# Patient Record
Sex: Female | Born: 1935 | Race: White | Hispanic: No | Smoking: Never smoker
Health system: Southern US, Community
[De-identification: ages and names within clinical notes are randomized; demographics above are authoritative.]

## PROBLEM LIST (undated history)

## (undated) DIAGNOSIS — N281 Cyst of kidney, acquired: Secondary | ICD-10-CM

## (undated) DIAGNOSIS — R300 Dysuria: Secondary | ICD-10-CM

## (undated) DIAGNOSIS — D696 Thrombocytopenia, unspecified: Secondary | ICD-10-CM

## (undated) DIAGNOSIS — Z950 Presence of cardiac pacemaker: Secondary | ICD-10-CM

## (undated) DIAGNOSIS — I7300 Raynaud's syndrome without gangrene: Secondary | ICD-10-CM

## (undated) DIAGNOSIS — L309 Dermatitis, unspecified: Secondary | ICD-10-CM

## (undated) DIAGNOSIS — M792 Neuralgia and neuritis, unspecified: Secondary | ICD-10-CM

## (undated) DIAGNOSIS — M359 Systemic involvement of connective tissue, unspecified: Secondary | ICD-10-CM

## (undated) DIAGNOSIS — R600 Localized edema: Secondary | ICD-10-CM

## (undated) DIAGNOSIS — M1990 Unspecified osteoarthritis, unspecified site: Secondary | ICD-10-CM

## (undated) DIAGNOSIS — R001 Bradycardia, unspecified: Secondary | ICD-10-CM

## (undated) DIAGNOSIS — R609 Edema, unspecified: Secondary | ICD-10-CM

## (undated) DIAGNOSIS — K7689 Other specified diseases of liver: Secondary | ICD-10-CM

## (undated) DIAGNOSIS — K5790 Diverticulosis of intestine, part unspecified, without perforation or abscess without bleeding: Secondary | ICD-10-CM

## (undated) DIAGNOSIS — R52 Pain, unspecified: Secondary | ICD-10-CM

## (undated) HISTORY — PX: PARATHYROIDECTOMY: SHX19

## (undated) HISTORY — PX: CHOLECYSTECTOMY: SHX55

## (undated) HISTORY — PX: ABDOMINAL HYSTERECTOMY: SHX81

## (undated) HISTORY — DX: Presence of cardiac pacemaker: Z95.0

## (undated) HISTORY — PX: EYE SURGERY: SHX253

---

## 2012-11-30 ENCOUNTER — Ambulatory Visit: Payer: Self-pay | Admitting: Family Medicine

## 2013-09-13 ENCOUNTER — Ambulatory Visit (INDEPENDENT_AMBULATORY_CARE_PROVIDER_SITE_OTHER): Payer: 59 | Admitting: Podiatry

## 2013-09-13 ENCOUNTER — Ambulatory Visit (INDEPENDENT_AMBULATORY_CARE_PROVIDER_SITE_OTHER): Payer: 59

## 2013-09-13 ENCOUNTER — Encounter: Payer: Self-pay | Admitting: Podiatry

## 2013-09-13 VITALS — BP 126/73 | HR 75 | Resp 16 | Ht <= 58 in | Wt 115.0 lb

## 2013-09-13 DIAGNOSIS — M722 Plantar fascial fibromatosis: Secondary | ICD-10-CM

## 2013-09-13 DIAGNOSIS — R609 Edema, unspecified: Secondary | ICD-10-CM

## 2013-09-13 DIAGNOSIS — M79609 Pain in unspecified limb: Secondary | ICD-10-CM

## 2013-09-13 DIAGNOSIS — B351 Tinea unguium: Secondary | ICD-10-CM

## 2013-09-13 NOTE — Progress Notes (Signed)
She presents today with a chief complaint of painful toenails bilateral. Is also complaining of a painful lesion to the plantar aspect of the forefoot right. Her major complaint is the swelling and pain along the plantar plantar lateral aspect of the left foot. She denies trauma she states is chronic in nature and she really does nothing for it. She has tried different shoe gear to no avail.  Objective: Pulses are palpable bilateral. Hammertoe deformities are present bilateral. Her nails are thick yellow dystrophic with mycotic and painful palpation bilateral. She has pain on palpation to the lateral aspect of the left foot along the peroneal tendons where there is some swelling. She has good range of motion and no loss of function along her peroneals. Radiographic evaluation does not demonstrate any type of osseous abnormalities in this area. Porokeratosis to the plantar aspect of the right forefoot.  Assessment: Edema with peroneal tendinitis left foot. Pain in limb secondary to onychomycosis 1 through 5 bilateral. Porokeratosis plantar aspect of the right foot.

## 2013-12-20 ENCOUNTER — Ambulatory Visit: Payer: Self-pay | Admitting: Family Medicine

## 2014-03-14 ENCOUNTER — Ambulatory Visit (INDEPENDENT_AMBULATORY_CARE_PROVIDER_SITE_OTHER): Payer: 59 | Admitting: Podiatry

## 2014-03-14 ENCOUNTER — Encounter: Payer: Self-pay | Admitting: Podiatry

## 2014-03-14 VITALS — BP 114/64 | HR 80 | Resp 12

## 2014-03-14 DIAGNOSIS — M722 Plantar fascial fibromatosis: Secondary | ICD-10-CM | POA: Diagnosis not present

## 2014-03-14 DIAGNOSIS — B351 Tinea unguium: Secondary | ICD-10-CM | POA: Diagnosis not present

## 2014-03-14 DIAGNOSIS — M79609 Pain in unspecified limb: Secondary | ICD-10-CM | POA: Diagnosis not present

## 2014-03-14 DIAGNOSIS — Q828 Other specified congenital malformations of skin: Secondary | ICD-10-CM

## 2014-03-14 NOTE — Progress Notes (Signed)
She presents today for 6 month follow-up bilateral foot. She states that the left heel just doesn't seem to be getting any better and is hard spot on the bottom of my right foot continues to return. I also need help trimming these nails. They're painful and they hurt with shoes.  Objective: Vital signs are stable she is alert and oriented 3. Mildly tibia deformity and hammertoe deformities bilateral. Pulses are palpable bilateral. She has pain on palpation medial calcaneal tubercle of the left heel she has a porokeratotic lesion without complication some metatarsophalangeal joint right foot.  Assessment: Porokeratosis right plantar fascitis left. Pain in limb secondary to onychomycosis.  Plan: Debridement of all reactive hyperkeratosis. Debridement of nails 1 through 5. Injected the left heel today with Kenalog and local anesthetic. Follow-up with her in 6 months.

## 2014-04-10 ENCOUNTER — Emergency Department: Payer: Self-pay | Admitting: Physician Assistant

## 2014-04-20 ENCOUNTER — Ambulatory Visit
Admit: 2014-04-20 | Disposition: A | Discharge status: Home / Self Care | Payer: Self-pay | Attending: Orthopedic Surgery | Admitting: Orthopedic Surgery

## 2014-08-02 ENCOUNTER — Encounter
Admission: RE | Admit: 2014-08-02 | Discharge: 2014-08-02 | Disposition: A | Discharge status: Home / Self Care | Payer: Medicare PPO | Source: Ambulatory Visit | Attending: Ophthalmology | Admitting: Ophthalmology

## 2014-08-02 DIAGNOSIS — I10 Essential (primary) hypertension: Secondary | ICD-10-CM | POA: Insufficient documentation

## 2014-08-02 DIAGNOSIS — Z01810 Encounter for preprocedural cardiovascular examination: Secondary | ICD-10-CM | POA: Insufficient documentation

## 2014-08-03 ENCOUNTER — Encounter: Payer: Self-pay | Admitting: *Deleted

## 2014-08-03 DIAGNOSIS — K7689 Other specified diseases of liver: Secondary | ICD-10-CM | POA: Diagnosis not present

## 2014-08-03 DIAGNOSIS — N281 Cyst of kidney, acquired: Secondary | ICD-10-CM | POA: Diagnosis not present

## 2014-08-03 DIAGNOSIS — M1990 Unspecified osteoarthritis, unspecified site: Secondary | ICD-10-CM | POA: Diagnosis not present

## 2014-08-03 DIAGNOSIS — R609 Edema, unspecified: Secondary | ICD-10-CM | POA: Diagnosis not present

## 2014-08-03 DIAGNOSIS — M810 Age-related osteoporosis without current pathological fracture: Secondary | ICD-10-CM | POA: Diagnosis not present

## 2014-08-03 DIAGNOSIS — I7300 Raynaud's syndrome without gangrene: Secondary | ICD-10-CM | POA: Diagnosis not present

## 2014-08-03 DIAGNOSIS — M792 Neuralgia and neuritis, unspecified: Secondary | ICD-10-CM | POA: Diagnosis not present

## 2014-08-03 DIAGNOSIS — I252 Old myocardial infarction: Secondary | ICD-10-CM | POA: Diagnosis not present

## 2014-08-03 DIAGNOSIS — I739 Peripheral vascular disease, unspecified: Secondary | ICD-10-CM | POA: Diagnosis not present

## 2014-08-03 DIAGNOSIS — H2511 Age-related nuclear cataract, right eye: Secondary | ICD-10-CM | POA: Diagnosis present

## 2014-08-03 DIAGNOSIS — G709 Myoneural disorder, unspecified: Secondary | ICD-10-CM | POA: Diagnosis not present

## 2014-08-08 ENCOUNTER — Encounter: Payer: Self-pay | Admitting: *Deleted

## 2014-08-08 ENCOUNTER — Ambulatory Visit: Payer: Medicare PPO | Admitting: Anesthesiology

## 2014-08-08 ENCOUNTER — Ambulatory Visit
Admission: EM | Admit: 2014-08-08 | Discharge: 2014-08-08 | Disposition: A | Discharge status: Home / Self Care | Payer: Medicare PPO | Source: Ambulatory Visit | Attending: Ophthalmology | Admitting: Ophthalmology

## 2014-08-08 ENCOUNTER — Encounter
Admission: EM | Disposition: A | Discharge status: Home / Self Care | Payer: Self-pay | Source: Ambulatory Visit | Attending: Ophthalmology

## 2014-08-08 DIAGNOSIS — M1990 Unspecified osteoarthritis, unspecified site: Secondary | ICD-10-CM | POA: Insufficient documentation

## 2014-08-08 DIAGNOSIS — I739 Peripheral vascular disease, unspecified: Secondary | ICD-10-CM | POA: Insufficient documentation

## 2014-08-08 DIAGNOSIS — M810 Age-related osteoporosis without current pathological fracture: Secondary | ICD-10-CM | POA: Insufficient documentation

## 2014-08-08 DIAGNOSIS — G709 Myoneural disorder, unspecified: Secondary | ICD-10-CM | POA: Insufficient documentation

## 2014-08-08 DIAGNOSIS — M792 Neuralgia and neuritis, unspecified: Secondary | ICD-10-CM | POA: Insufficient documentation

## 2014-08-08 DIAGNOSIS — K7689 Other specified diseases of liver: Secondary | ICD-10-CM | POA: Insufficient documentation

## 2014-08-08 DIAGNOSIS — I7300 Raynaud's syndrome without gangrene: Secondary | ICD-10-CM | POA: Insufficient documentation

## 2014-08-08 DIAGNOSIS — R609 Edema, unspecified: Secondary | ICD-10-CM | POA: Insufficient documentation

## 2014-08-08 DIAGNOSIS — N281 Cyst of kidney, acquired: Secondary | ICD-10-CM | POA: Insufficient documentation

## 2014-08-08 DIAGNOSIS — I252 Old myocardial infarction: Secondary | ICD-10-CM | POA: Insufficient documentation

## 2014-08-08 DIAGNOSIS — H2511 Age-related nuclear cataract, right eye: Secondary | ICD-10-CM | POA: Diagnosis not present

## 2014-08-08 HISTORY — DX: Pain, unspecified: R52

## 2014-08-08 HISTORY — DX: Neuralgia and neuritis, unspecified: M79.2

## 2014-08-08 HISTORY — DX: Edema, unspecified: R60.9

## 2014-08-08 HISTORY — PX: CATARACT EXTRACTION W/PHACO: SHX586

## 2014-08-08 HISTORY — DX: Other specified diseases of liver: K76.89

## 2014-08-08 HISTORY — DX: Raynaud's syndrome without gangrene: I73.00

## 2014-08-08 HISTORY — DX: Unspecified osteoarthritis, unspecified site: M19.90

## 2014-08-08 HISTORY — DX: Cyst of kidney, acquired: N28.1

## 2014-08-08 SURGERY — PHACOEMULSIFICATION, CATARACT, WITH IOL INSERTION
Anesthesia: Monitor Anesthesia Care | Laterality: Right | Wound class: Clean

## 2014-08-08 MED ORDER — LIDOCAINE HCL (PF) 4 % IJ SOLN
INTRAMUSCULAR | Status: DC | PRN
  Administered 2014-08-08: 5 mL via OPHTHALMIC

## 2014-08-08 MED ORDER — EPINEPHRINE HCL 1 MG/ML IJ SOLN
INTRAMUSCULAR | Status: DC | PRN
  Administered 2014-08-08: 200 mL

## 2014-08-08 MED ORDER — NA CHONDROIT SULF-NA HYALURON 40-17 MG/ML IO SOLN
INTRAOCULAR | Status: DC | PRN
  Administered 2014-08-08: 1 mL via INTRAOCULAR

## 2014-08-08 MED ORDER — ALFENTANIL 500 MCG/ML IJ INJ
INJECTION | INTRAMUSCULAR | Status: DC | PRN
  Administered 2014-08-08: 700 ug via INTRAVENOUS

## 2014-08-08 MED ORDER — CARBACHOL 0.01 % IO SOLN
INTRAOCULAR | Status: DC | PRN
  Administered 2014-08-08: 0.5 mL via INTRAOCULAR

## 2014-08-08 MED ORDER — MOXIFLOXACIN HCL 0.5 % OP SOLN
1.0000 [drp] | OPHTHALMIC | Status: AC | PRN
  Administered 2014-08-08 (×3): 1 [drp] via OPHTHALMIC

## 2014-08-08 MED ORDER — MIDAZOLAM HCL 2 MG/2ML IJ SOLN
INTRAMUSCULAR | Status: DC | PRN
  Administered 2014-08-08 (×2): 1 mg via INTRAVENOUS

## 2014-08-08 MED ORDER — CYCLOPENTOLATE HCL 2 % OP SOLN
1.0000 [drp] | OPHTHALMIC | Status: AC | PRN
  Administered 2014-08-08 (×4): 1 [drp] via OPHTHALMIC

## 2014-08-08 MED ORDER — LIDOCAINE HCL (PF) 1 % IJ SOLN
INTRAOCULAR | Status: DC | PRN
  Administered 2014-08-08: 4 mL via OPHTHALMIC

## 2014-08-08 MED ORDER — TETRACAINE HCL 0.5 % OP SOLN
OPHTHALMIC | Status: DC | PRN
  Administered 2014-08-08: 1 [drp] via OPHTHALMIC

## 2014-08-08 MED ORDER — SODIUM CHLORIDE 0.9 % IV SOLN
INTRAVENOUS | Status: DC
  Administered 2014-08-08 (×2): via INTRAVENOUS

## 2014-08-08 MED ORDER — MOXIFLOXACIN HCL 0.5 % OP SOLN
OPHTHALMIC | Status: DC | PRN
  Administered 2014-08-08: 1 [drp] via OPHTHALMIC

## 2014-08-08 MED ORDER — PHENYLEPHRINE HCL 10 % OP SOLN
1.0000 [drp] | OPHTHALMIC | Status: AC | PRN
  Administered 2014-08-08 (×4): 1 [drp] via OPHTHALMIC

## 2014-08-08 SURGICAL SUPPLY — 25 items
CORD BIP STRL DISP 12FT (MISCELLANEOUS) ×3 IMPLANT
DRAPE XRAY CASSETTE 23X24 (DRAPES) ×3 IMPLANT
ERASER HMR WETFIELD 18G (MISCELLANEOUS) ×3 IMPLANT
GLOVE BIO SURGEON STRL SZ8 (GLOVE) ×3 IMPLANT
GLOVE SURG LX 6.5 MICRO (GLOVE) ×2
GLOVE SURG LX 8.0 MICRO (GLOVE) ×2
GLOVE SURG LX STRL 6.5 MICRO (GLOVE) ×1 IMPLANT
GLOVE SURG LX STRL 8.0 MICRO (GLOVE) ×1 IMPLANT
GOWN STRL REUS W/ TWL LRG LVL3 (GOWN DISPOSABLE) ×1 IMPLANT
GOWN STRL REUS W/ TWL XL LVL3 (GOWN DISPOSABLE) ×1 IMPLANT
GOWN STRL REUS W/TWL LRG LVL3 (GOWN DISPOSABLE) ×2
GOWN STRL REUS W/TWL XL LVL3 (GOWN DISPOSABLE) ×2
LENS IOL ACRYSERT 17.0 (Intraocular Lens) ×3 IMPLANT
PACK CATARACT (MISCELLANEOUS) ×3 IMPLANT
PACK CATARACT DINGLEDEIN LX (MISCELLANEOUS) ×3 IMPLANT
PACK EYE AFTER SURG (MISCELLANEOUS) ×3 IMPLANT
SHLD EYE VISITEC  UNIV (MISCELLANEOUS) ×3 IMPLANT
SN6CWS ACRYSOF LENS ×3 IMPLANT
SOL PREP PVP 2OZ (MISCELLANEOUS) ×3
SOLUTION PREP PVP 2OZ (MISCELLANEOUS) ×1 IMPLANT
SUT SILK 5-0 (SUTURE) ×3 IMPLANT
SYR 5ML LL (SYRINGE) ×3 IMPLANT
SYR TB 1ML 27GX1/2 LL (SYRINGE) ×3 IMPLANT
WATER STERILE IRR 1000ML POUR (IV SOLUTION) ×3 IMPLANT
WIPE NON LINTING 3.25X3.25 (MISCELLANEOUS) ×3 IMPLANT

## 2014-08-08 NOTE — Op Note (Signed)
Date of Surgery: 08/08/2014 Date of Dictation: 08/08/2014 8:11 AM Pre-operative Diagnosis:  Nuclear Sclerotic Cataract right Eye Post-operative Diagnosis: same Procedure performed: Extra-capsular Cataract Extraction (ECCE) with placement of a posterior chamber intraocular lens (IOL) right Eye IOL:  Implant Name Type Inv. Item Serial No. Manufacturer Lot No. LRB No. Used  [id number] ACRYSOF LENS     [id number] ALCON   Right 1   Anesthesia: 2% Lidocaine and 4% Marcaine in a 50/50 mixture with 10 unites/ml of Hylenex given as a peribulbar Anesthesiologist: Anesthesiologist: Andria Frames, MD CRNA: Sinda Du, CRNA Complications: none Estimated Blood Loss: less than 1 ml  Description of procedure:  The patient was given anesthesia and sedation via intravenous access. The patient was then prepped and draped in the usual fashion. A 25-gauge needle was bent for initiating the capsulorhexis. A 5-0 silk suture was placed through the conjunctiva superior and inferiorly to serve as bridle sutures. Hemostasis was obtained at the superior limbus using an eraser cautery. A partial thickness groove was made at the anterior surgical limbus with a 64 Beaver blade and this was dissected anteriorly with an Avaya. The anterior chamber was entered at 10 o'clock with a 1.0 mm paracentesis knife and through the lamellar dissection with a 2.6 mm Alcon keratome. DiscoVisc was injected to replace the aqueous and a continuous tear curvilinear capsulorhexis was performed using a bent 25-gauge needle.  Balance salt on a syringe was used to perform hydro-dissection and phacoemulsification was carried out using a divide and conquer technique. Procedure(s) with comments: CATARACT EXTRACTION PHACO AND INTRAOCULAR LENS PLACEMENT (IOC) (Right) - US:00:52 AP:24.8 CDE:22.37 Pack Lot #:[id number]. Irrigation/aspiration was used to remove the residual cortex and the capsular bag was inflated with DiscoVisc. The  intraocular lens was inserted into the capsular bag using a pre-loaded UltraSert Delivery System. Irrigation/aspiration was used to remove the residual DiscoVisc. The wound was inflated with balanced salt and checked for leaks. None were found. Miostat was injected via the paracentesis track and 0.1 ml of Vigamox containing 1 mg of drug  was injected via the paracentesis track. The wound was checked for leaks again and none were found.   The bridal sutures were removed and two drops of Vigamox were placed on the eye. An eye shield was placed to protect the eye and the patient was discharged to the recovery area in good condition.   Adrion Menz MD

## 2014-08-08 NOTE — Interval H&P Note (Signed)
History and Physical Interval Note:  08/08/2014 7:19 AM  [name]  has presented today for surgery, with the diagnosis of Cataract  The various methods of treatment have been discussed with the patient and family. After consideration of risks, benefits and other options for treatment, the patient has consented to  Procedure(s): CATARACT EXTRACTION PHACO AND INTRAOCULAR LENS PLACEMENT ([place]) (Right) as a surgical intervention .  The patient's history has been reviewed, patient examined, no change in status, stable for surgery.  I have reviewed the patient's chart and labs.  Questions were answered to the patient's satisfaction.     Kelli [name]

## 2014-08-08 NOTE — Anesthesia Postprocedure Evaluation (Signed)
  Anesthesia Post-op Note  Patient: Kelli [name]  Procedure(s) Performed: Procedure(s) with comments: CATARACT EXTRACTION PHACO AND INTRAOCULAR LENS PLACEMENT ([facility]) (Right) - US:00:52 AP:24.8 CDE:22.37 Pack Lot #:[id number]  Anesthesia type:MAC  Patient location: PACU  Post pain: Pain level controlled  Post assessment: Post-op Vital signs reviewed, Patient's Cardiovascular Status Stable, Respiratory Function Stable, Patent Airway and No signs of Nausea or vomiting  Post vital signs: Reviewed and stable  Last Vitals:  Filed Vitals:   08/08/14 0834  BP:   Pulse: 72  Temp:   Resp: 16    Level of consciousness: awake, alert  and patient cooperative  Complications: No apparent anesthesia complications

## 2014-08-08 NOTE — Discharge Instructions (Addendum)
..  see handout. Cataract Surgery Care After Refer to this sheet in the next few weeks. These instructions provide you with information on caring for yourself after your procedure. Your caregiver may also give you more specific instructions. Your treatment has been planned according to current medical practices, but problems sometimes occur. Call your caregiver if you have any problems or questions after your procedure.  HOME CARE INSTRUCTIONS   Avoid strenuous activities as directed by your caregiver.  Ask your caregiver when you can resume driving.  Use eyedrops or other medicines to help healing and control pressure inside your eye as directed by your caregiver.  Only take over-the-counter or prescription medicines for pain, discomfort, or fever as directed by your caregiver.  Do not to touch or rub your eyes.  You may be instructed to use a protective shield during the first few days and nights after surgery. If not, wear sunglasses to protect your eyes. This is to protect the eye from pressure or from being accidentally bumped.  Keep the area around your eye clean and dry. Avoid swimming or allowing water to hit you directly in the face while showering. Keep soap and shampoo out of your eyes.  Do not bend or lift heavy objects. Bending increases pressure in the eye. You can walk, climb stairs, and do light household chores.  Do not put a contact lens into the eye that had surgery until your caregiver says it is okay to do so.  Ask your doctor when you can return to work. This will depend on the kind of work that you do. If you work in a dusty environment, you may be advised to wear protective eyewear for a period of time.  Ask your caregiver when it will be safe to engage in sexual activity.  Continue with your regular eye exams as directed by your caregiver. What to expect:  It is normal to feel itching and mild discomfort for a few days after cataract surgery. Some fluid discharge  is also common, and your eye may be sensitive to light and touch.  After 1 to 2 days, even moderate discomfort should disappear. In most cases, healing will take about 6 weeks.  If you received an intraocular lens (IOL), you may notice that colors are very bright or have a blue tinge. Also, if you have been in bright sunlight, everything may appear reddish for a few hours. If you see these color tinges, it is because your lens is clear and no longer cloudy. Within a few months after receiving an IOL, these extra colors should go away. When you have healed, you will probably need new glasses. SEEK MEDICAL CARE IF:   You have increased bruising around your eye.  You have discomfort not helped by medicine. SEEK IMMEDIATE MEDICAL CARE IF:   You have a fever.  You have a worsening or sudden vision loss.  You have redness, swelling, or increasing pain in the eye.  You have a thick discharge from the eye that had surgery. MAKE SURE YOU:  Understand these instructions.  Will watch your condition.  Will get help right away if you are not doing well or get worse. Document Released: 07/27/2004 Document Revised: 04/01/2011 Document Reviewed: 08/31/2010 [facility] Patient Information 2015 [place], [place]. This information is not intended to replace advice given to you by your health care provider. Make sure you discuss any questions you have with your health care provider.

## 2014-08-08 NOTE — Anesthesia Preprocedure Evaluation (Signed)
Anesthesia Evaluation  Patient identified by MRN, date of birth, ID band Patient awake    Reviewed: Allergy & Precautions, H&P , NPO status , Patient's Chart, lab work & pertinent test results, reviewed documented beta blocker date and time   Airway Mallampati: II  TM Distance: >3 FB Neck ROM: full    Dental no notable dental hx. (+) Teeth Intact   Pulmonary neg pulmonary ROS,  breath sounds clear to auscultation  Pulmonary exam normal       Cardiovascular + Peripheral Vascular Disease - Past MI Normal cardiovascular examRhythm:regular Rate:Normal     Neuro/Psych  Neuromuscular disease negative psych ROS   GI/Hepatic negative GI ROS, Neg liver ROS,   Endo/Other  negative endocrine ROS  Renal/GU Renal disease  negative genitourinary   Musculoskeletal   Abdominal   Peds  Hematology negative hematology ROS (+)   Anesthesia Other Findings Past Medical History:   Neuralgia                                                      Comment:obsidia   Arthritis                                                    Cyst of kidney, acquired                                     Liver cyst                                                   Pain                                                           Comment:back/shoulder   Raynaud's disease                                            Edema                                                          Comment:feet/ankles   Reproductive/Obstetrics negative OB ROS                             Anesthesia Physical Anesthesia Plan  ASA: III  Anesthesia Plan: MAC   Post-op Pain Management:    Induction:   Airway Management Planned:   Additional Equipment:   Intra-op Plan:   Post-operative Plan:   Informed Consent:  I have reviewed the patients History and Physical, chart, labs and discussed the procedure including the risks, benefits and alternatives for the  proposed anesthesia with the patient or authorized representative who has indicated his/her understanding and acceptance.   Dental Advisory Given  Plan Discussed with: Anesthesiologist, CRNA and Surgeon  Anesthesia Plan Comments:         Anesthesia Quick Evaluation

## 2014-08-08 NOTE — H&P (Signed)
  History and physical was faxed and scanned in.   

## 2014-08-08 NOTE — Transfer of Care (Signed)
Immediate Anesthesia Transfer of Care Note  Patient: Kelli [name]  Procedure(s) Performed: Procedure(s) with comments: CATARACT EXTRACTION PHACO AND INTRAOCULAR LENS PLACEMENT ([facility]) (Right) - US:00:52 AP:24.8 CDE:22.37 Pack Lot #:[id number]  Patient Location: PACU  Anesthesia Type:MAC  Level of Consciousness: awake, alert  and oriented  Airway & Oxygen Therapy: Patient Spontanous Breathing  Post-op Assessment: Report given to RN and Post -op Vital signs reviewed and stable  Post vital signs: Reviewed  Last Vitals:  Filed Vitals:   08/08/14 0606  BP: 144/69  Pulse: 86  Temp: 36.6 C  Resp: 18    Complications: No apparent anesthesia complications

## 2014-09-12 ENCOUNTER — Ambulatory Visit (INDEPENDENT_AMBULATORY_CARE_PROVIDER_SITE_OTHER): Payer: 59 | Admitting: Podiatry

## 2014-09-12 ENCOUNTER — Encounter: Payer: Self-pay | Admitting: Podiatry

## 2014-09-12 DIAGNOSIS — B070 Plantar wart: Secondary | ICD-10-CM

## 2014-09-12 DIAGNOSIS — M79609 Pain in unspecified limb: Secondary | ICD-10-CM | POA: Diagnosis not present

## 2014-09-12 DIAGNOSIS — B351 Tinea unguium: Secondary | ICD-10-CM | POA: Diagnosis not present

## 2014-09-12 DIAGNOSIS — Q828 Other specified congenital malformations of skin: Secondary | ICD-10-CM
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
## 2014-09-14 ENCOUNTER — Encounter: Payer: Self-pay | Admitting: *Deleted

## 2014-09-14 DIAGNOSIS — Z881 Allergy status to other antibiotic agents status: Secondary | ICD-10-CM | POA: Diagnosis not present

## 2014-09-14 DIAGNOSIS — H2512 Age-related nuclear cataract, left eye: Secondary | ICD-10-CM | POA: Diagnosis present

## 2014-09-14 DIAGNOSIS — Z882 Allergy status to sulfonamides status: Secondary | ICD-10-CM | POA: Diagnosis not present

## 2014-09-14 DIAGNOSIS — Z885 Allergy status to narcotic agent status: Secondary | ICD-10-CM | POA: Diagnosis not present

## 2014-09-14 DIAGNOSIS — Z888 Allergy status to other drugs, medicaments and biological substances status: Secondary | ICD-10-CM | POA: Diagnosis not present

## 2014-09-14 DIAGNOSIS — G8929 Other chronic pain: Secondary | ICD-10-CM | POA: Diagnosis not present

## 2014-09-14 DIAGNOSIS — I739 Peripheral vascular disease, unspecified: Secondary | ICD-10-CM | POA: Diagnosis not present

## 2014-09-14 DIAGNOSIS — M1990 Unspecified osteoarthritis, unspecified site: Secondary | ICD-10-CM | POA: Diagnosis not present

## 2014-09-19 ENCOUNTER — Ambulatory Visit: Payer: Medicare PPO | Admitting: Certified Registered Nurse Anesthetist

## 2014-09-19 ENCOUNTER — Encounter: Payer: Self-pay | Admitting: *Deleted

## 2014-09-19 ENCOUNTER — Encounter
Admission: RE | Disposition: A | Discharge status: Home / Self Care | Payer: Self-pay | Source: Ambulatory Visit | Attending: Ophthalmology

## 2014-09-19 ENCOUNTER — Ambulatory Visit
Admission: RE | Admit: 2014-09-19 | Discharge: 2014-09-19 | Disposition: A | Discharge status: Home / Self Care | Payer: Medicare PPO | Source: Ambulatory Visit | Attending: Ophthalmology | Admitting: Ophthalmology

## 2014-09-19 DIAGNOSIS — Z885 Allergy status to narcotic agent status: Secondary | ICD-10-CM | POA: Insufficient documentation

## 2014-09-19 DIAGNOSIS — H2512 Age-related nuclear cataract, left eye: Secondary | ICD-10-CM | POA: Insufficient documentation

## 2014-09-19 DIAGNOSIS — Z882 Allergy status to sulfonamides status: Secondary | ICD-10-CM | POA: Insufficient documentation

## 2014-09-19 DIAGNOSIS — Z888 Allergy status to other drugs, medicaments and biological substances status: Secondary | ICD-10-CM | POA: Insufficient documentation

## 2014-09-19 DIAGNOSIS — Z881 Allergy status to other antibiotic agents status: Secondary | ICD-10-CM | POA: Insufficient documentation

## 2014-09-19 DIAGNOSIS — I739 Peripheral vascular disease, unspecified: Secondary | ICD-10-CM | POA: Insufficient documentation

## 2014-09-19 DIAGNOSIS — G8929 Other chronic pain: Secondary | ICD-10-CM | POA: Insufficient documentation

## 2014-09-19 DIAGNOSIS — M1990 Unspecified osteoarthritis, unspecified site: Secondary | ICD-10-CM | POA: Insufficient documentation

## 2014-09-19 HISTORY — DX: Thrombocytopenia, unspecified: D69.6

## 2014-09-19 HISTORY — PX: CATARACT EXTRACTION W/PHACO: SHX586

## 2014-09-19 HISTORY — DX: Raynaud's syndrome without gangrene: I73.00

## 2014-09-19 SURGERY — PHACOEMULSIFICATION, CATARACT, WITH IOL INSERTION
Anesthesia: Monitor Anesthesia Care | Site: Eye | Laterality: Left

## 2014-09-19 MED ORDER — MOXIFLOXACIN HCL 0.5 % OP SOLN
OPHTHALMIC | Status: DC | PRN
Start: 2014-09-19 — End: 2014-09-19
  Administered 2014-09-19: 9 [drp] via OPHTHALMIC

## 2014-09-19 MED ORDER — NA CHONDROIT SULF-NA HYALURON 40-17 MG/ML IO SOLN
INTRAOCULAR | Status: AC
  Filled 2014-09-19: qty 1

## 2014-09-19 MED ORDER — PHENYLEPHRINE HCL 10 % OP SOLN
OPHTHALMIC | Status: AC
  Administered 2014-09-19: 08:00:00 via OPHTHALMIC
  Filled 2014-09-19: qty 5

## 2014-09-19 MED ORDER — ONDANSETRON HCL 4 MG/2ML IJ SOLN: 4.0000 mg | Freq: Once | INTRAMUSCULAR | Status: DC | PRN

## 2014-09-19 MED ORDER — EPINEPHRINE HCL 1 MG/ML IJ SOLN
INTRAMUSCULAR | Status: AC
  Filled 2014-09-19: qty 2

## 2014-09-19 MED ORDER — CYCLOPENTOLATE HCL 2 % OP SOLN
OPHTHALMIC | Status: AC
  Administered 2014-09-19: 08:00:00
  Filled 2014-09-19: qty 2

## 2014-09-19 MED ORDER — TETRACAINE HCL 0.5 % OP SOLN
OPHTHALMIC | Status: AC
  Filled 2014-09-19: qty 2

## 2014-09-19 MED ORDER — CEFUROXIME OPHTHALMIC INJECTION 1 MG/0.1 ML
INJECTION | OPHTHALMIC | Status: AC
  Filled 2014-09-19: qty 0.1

## 2014-09-19 MED ORDER — PHENYLEPHRINE HCL 10 % OP SOLN
1.0000 [drp] | OPHTHALMIC | Status: AC | PRN
  Administered 2014-09-19 (×4): 1 [drp] via OPHTHALMIC

## 2014-09-19 MED ORDER — NA CHONDROIT SULF-NA HYALURON 40-17 MG/ML IO SOLN
INTRAOCULAR | Status: DC | PRN
Start: 2014-09-19 — End: 2014-09-19
  Administered 2014-09-19: 1 mL via INTRAOCULAR

## 2014-09-19 MED ORDER — SODIUM CHLORIDE 0.9 % IV SOLN
INTRAVENOUS | Status: DC
  Administered 2014-09-19: 08:00:00 via INTRAVENOUS

## 2014-09-19 MED ORDER — MOXIFLOXACIN HCL 0.5 % OP SOLN
1.0000 [drp] | OPHTHALMIC | Status: AC | PRN
  Administered 2014-09-19 (×3): 1 [drp] via OPHTHALMIC

## 2014-09-19 MED ORDER — TETRACAINE HCL 0.5 % OP SOLN
OPHTHALMIC | Status: DC | PRN
  Administered 2014-09-19: 2 [drp] via OPHTHALMIC

## 2014-09-19 MED ORDER — BUPIVACAINE HCL (PF) 0.75 % IJ SOLN
INTRAMUSCULAR | Status: AC
  Filled 2014-09-19: qty 10

## 2014-09-19 MED ORDER — MIDAZOLAM HCL 2 MG/2ML IJ SOLN
INTRAMUSCULAR | Status: DC | PRN
  Administered 2014-09-19: 0.5 mg via INTRAVENOUS

## 2014-09-19 MED ORDER — CYCLOPENTOLATE HCL 2 % OP SOLN
1.0000 [drp] | OPHTHALMIC | Status: AC | PRN
  Administered 2014-09-19 (×4): 1 [drp] via OPHTHALMIC

## 2014-09-19 MED ORDER — EPINEPHRINE HCL 1 MG/ML IJ SOLN
INTRAOCULAR | Status: DC | PRN
  Administered 2014-09-19: 200 mL via OPHTHALMIC

## 2014-09-19 MED ORDER — CARBACHOL 0.01 % IO SOLN
INTRAOCULAR | Status: DC | PRN
  Administered 2014-09-19: 0.5 mL via INTRAOCULAR

## 2014-09-19 MED ORDER — ALFENTANIL 500 MCG/ML IJ INJ
INJECTION | INTRAMUSCULAR | Status: DC | PRN
  Administered 2014-09-19: 250 ug via INTRAVENOUS

## 2014-09-19 MED ORDER — FENTANYL CITRATE (PF) 100 MCG/2ML IJ SOLN: 25.0000 ug | INTRAMUSCULAR | Status: DC | PRN

## 2014-09-19 MED ORDER — HYALURONIDASE HUMAN 150 UNIT/ML IJ SOLN
INTRAMUSCULAR | Status: AC
  Filled 2014-09-19: qty 1

## 2014-09-19 MED ORDER — LIDOCAINE HCL (PF) 4 % IJ SOLN
INTRAMUSCULAR | Status: AC
  Filled 2014-09-19: qty 5

## 2014-09-19 MED ORDER — MOXIFLOXACIN HCL 0.5 % OP SOLN
OPHTHALMIC | Status: AC
  Filled 2014-09-19: qty 3

## 2014-09-19 MED ORDER — LIDOCAINE HCL (PF) 1 % IJ SOLN
INTRAMUSCULAR | Status: DC | PRN
Start: 2014-09-19 — End: 2014-09-19
  Administered 2014-09-19: 4 mL via OPHTHALMIC

## 2014-09-19 MED ORDER — LIDOCAINE HCL (PF) 4 % IJ SOLN
INTRAMUSCULAR | Status: DC | PRN
  Administered 2014-09-19: 4 mL via OPHTHALMIC

## 2014-09-19 SURGICAL SUPPLY — 30 items
CANNULA ANT/CHMB 27GA (MISCELLANEOUS) ×3 IMPLANT
CORD BIP STRL DISP 12FT (MISCELLANEOUS) ×3 IMPLANT
CUP MEDICINE 2OZ PLAST GRAD ST (MISCELLANEOUS) ×3 IMPLANT
DRAPE XRAY CASSETTE 23X24 (DRAPES) ×3 IMPLANT
ERASER HMR WETFIELD 18G (MISCELLANEOUS) ×3 IMPLANT
GLOVE BIO SURGEON STRL SZ8 (GLOVE) ×3 IMPLANT
GLOVE SURG LX 6.5 MICRO (GLOVE) ×2
GLOVE SURG LX 8.0 MICRO (GLOVE) ×2
GLOVE SURG LX STRL 6.5 MICRO (GLOVE) ×1 IMPLANT
GLOVE SURG LX STRL 8.0 MICRO (GLOVE) ×1 IMPLANT
GOWN STRL REUS W/ TWL LRG LVL3 (GOWN DISPOSABLE) ×1 IMPLANT
GOWN STRL REUS W/ TWL XL LVL3 (GOWN DISPOSABLE) ×1 IMPLANT
GOWN STRL REUS W/TWL LRG LVL3 (GOWN DISPOSABLE) ×2
GOWN STRL REUS W/TWL XL LVL3 (GOWN DISPOSABLE) ×2
LENS IOL ACRSF IQ ULTRA 17.5 (Intraocular Lens) ×1 IMPLANT
LENS IOL ACRYSOF IQ 17.5 (Intraocular Lens) ×3 IMPLANT
PACK CATARACT (MISCELLANEOUS) ×3 IMPLANT
PACK CATARACT DINGLEDEIN LX (MISCELLANEOUS) ×3 IMPLANT
PACK EYE AFTER SURG (MISCELLANEOUS) ×3 IMPLANT
SHLD EYE VISITEC  UNIV (MISCELLANEOUS) ×3 IMPLANT
SOL BSS BAG (MISCELLANEOUS) ×3
SOL PREP PVP 2OZ (MISCELLANEOUS) ×3
SOLUTION BSS BAG (MISCELLANEOUS) ×1 IMPLANT
SOLUTION PREP PVP 2OZ (MISCELLANEOUS) ×1 IMPLANT
SUT SILK 5-0 (SUTURE) ×3 IMPLANT
SYR 3ML LL SCALE MARK (SYRINGE) ×3 IMPLANT
SYR 5ML LL (SYRINGE) ×3 IMPLANT
SYR TB 1ML 27GX1/2 LL (SYRINGE) ×3 IMPLANT
WATER STERILE IRR 1000ML POUR (IV SOLUTION) ×3 IMPLANT
WIPE NON LINTING 3.25X3.25 (MISCELLANEOUS) ×3 IMPLANT

## 2014-09-19 NOTE — Interval H&P Note (Signed)
History and Physical Interval Note:  09/19/2014 8:56 AM  Kelli [name]  has presented today for surgery, with the diagnosis of CATARACT  The various methods of treatment have been discussed with the patient and family. After consideration of risks, benefits and other options for treatment, the patient has consented to  Procedure(s): CATARACT EXTRACTION PHACO AND INTRAOCULAR LENS PLACEMENT ([place]) (Left) as a surgical intervention .  The patient's history has been reviewed, patient examined, no change in status, stable for surgery.  I have reviewed the patient's chart and labs.  Questions were answered to the patient's satisfaction.     [name]

## 2014-09-19 NOTE — Anesthesia Procedure Notes (Signed)
Procedure Name: MAC Performed by: Ardel Jagger Pre-anesthesia Checklist: Patient identified, Suction available, Emergency Drugs available, Patient being monitored and Timeout performed Oxygen Delivery Method: Nasal cannula       

## 2014-09-19 NOTE — Transfer of Care (Signed)
Immediate Anesthesia Transfer of Care Note  Patient: Kelli [name]  Procedure(s) Performed: Procedure(s) with comments: CATARACT EXTRACTION PHACO AND INTRAOCULAR LENS PLACEMENT ([facility]) (Left) - [place]: 01:05.6 AP%: 25.7 CDE: 29.05 Lot # [id number]  Patient Location: PACU  Anesthesia Type:MAC  Level of Consciousness: awake, alert  and oriented  Airway & Oxygen Therapy: Patient Spontanous Breathing  Post-op Assessment: Report given to RN and Post -op Vital signs reviewed and stable  Post vital signs: Reviewed and stable  Last Vitals:  Filed Vitals:   09/19/14 0939  BP: 118/50  Pulse: 68  Temp: 36.1 C  Resp: 16    Complications: No apparent anesthesia complications

## 2014-09-19 NOTE — Anesthesia Postprocedure Evaluation (Signed)
  Anesthesia Post-op Note  Patient: Kelli [name]  Procedure(s) Performed: Procedure(s) with comments: CATARACT EXTRACTION PHACO AND INTRAOCULAR LENS PLACEMENT ([facility]) (Left) - [place]: 01:05.6 AP%: 25.7 CDE: 29.05 Lot # [id number]  Anesthesia type:MAC  Patient location: PACU  Post pain: Pain level controlled  Post assessment: Post-op Vital signs reviewed, Patient's Cardiovascular Status Stable, Respiratory Function Stable, Patent Airway and No signs of Nausea or vomiting  Post vital signs: Reviewed and stable  Last Vitals:  Filed Vitals:   09/19/14 0939  BP: 118/50  Pulse: 68  Temp: 36.1 C  Resp: 16    Level of consciousness: awake, alert  and patient cooperative  Complications: No apparent anesthesia complications

## 2014-09-19 NOTE — Anesthesia Preprocedure Evaluation (Addendum)
Anesthesia Evaluation  Patient identified by MRN, date of birth, ID band Patient awake    Reviewed: Allergy & Precautions, NPO status , Patient's Chart, lab work & pertinent test results  Airway Mallampati: I  TM Distance: >3 FB Neck ROM: Limited    Dental  (+) Teeth Intact   Pulmonary    Pulmonary exam normal       Cardiovascular Exercise Tolerance: Poor + Peripheral Vascular Disease Normal cardiovascular exam    Neuro/Psych Chronic pain.    GI/Hepatic   Endo/Other    Renal/GU      Musculoskeletal  (+) Arthritis -, Osteoarthritis,    Abdominal   Peds  Hematology   Anesthesia Other Findings   Reproductive/Obstetrics                            Anesthesia Physical Anesthesia Plan  ASA: III  Anesthesia Plan: MAC   Post-op Pain Management:    Induction: Intravenous  Airway Management Planned: Nasal Cannula  Additional Equipment:   Intra-op Plan:   Post-operative Plan:   Informed Consent: I have reviewed the patients History and Physical, chart, labs and discussed the procedure including the risks, benefits and alternatives for the proposed anesthesia with the patient or authorized representative who has indicated his/her understanding and acceptance.     Plan Discussed with: CRNA  Anesthesia Plan Comments:         Anesthesia Quick Evaluation

## 2014-09-19 NOTE — Discharge Instructions (Addendum)
See handoutAMBULATORY SURGERY  DISCHARGE INSTRUCTIONS   1) The drugs that you were given will stay in your system until tomorrow so for the next 24 hours you should not:  A) Drive an automobile B) Make any legal decisions C) Drink any alcoholic beverage   2) You may resume regular meals tomorrow.  Today it is better to start with liquids and gradually work up to solid foods.  You may eat anything you prefer, but it is better to start with liquids, then soup and crackers, and gradually work up to solid foods.   3) Please notify your doctor immediately if you have any unusual bleeding, trouble breathing, redness and pain at the surgery site, drainage, fever, or pain not relieved by medication.    4) Additional Instructions:        Please contact your physician with any problems or Same Day Surgery at [phone], Monday through Friday 6 am to 4 pm, or [place] at [facility] number at [phone]. Eye Surgery Discharge Instructions  Expect mild scratchy sensation or mild soreness. DO NOT RUB YOUR EYE!  The day of surgery:  Minimal physical activity, but bed rest is not required  No reading, computer work, or close hand work  No bending, lifting, or straining.  May watch TV  For 24 hours:  No driving, legal decisions, or alcoholic beverages  Safety precautions  Eat anything you prefer: It is better to start with liquids, then soup then solid foods.  _____ Eye patch should be worn until postoperative exam tomorrow.  ____ Solar shield eyeglasses should be worn for comfort in the sunlight/patch while sleeping  Resume all regular medications including aspirin or Coumadin if these were discontinued prior to surgery. You may shower, bathe, shave, or wash your hair. Tylenol may be taken for mild discomfort.  Call your doctor if you experience significant pain, nausea, or vomiting, fever > 101 or other signs of infection. [phone] or [phone] Specific  instructions:  Follow-up Information    Follow up with DINGELDEIN,STEVEN, MD In 1 day.   Specialty:  Ophthalmology   Why:  August 30 at 10:10am   Contact information:   [address]   [place] [address] [phone]

## 2014-09-19 NOTE — Addendum Note (Signed)
Addendum  created 09/19/14 0946 by Elyse Hsu, MD   Modules edited: Notes Section   Notes Section:  File: [id number]

## 2014-09-19 NOTE — Op Note (Signed)
Date of Surgery: 09/19/2014 Date of Dictation: 09/19/2014 9:36 AM Pre-operative Diagnosis:  Nuclear Sclerotic Cataract left Eye Post-operative Diagnosis: same Procedure performed: Extra-capsular Cataract Extraction (ECCE) with placement of a posterior chamber intraocular lens (IOL) left Eye IOL:  Implant Name Type Inv. Item Serial No. Manufacturer Lot No. LRB No. Used  LENS IOL ACRYSOF IQ 17.5 - [id number] Intraocular Lens LENS IOL ACRYSOF IQ 17.5 [id number] ALCON   Left 1   Anesthesia: 2% Lidocaine and 4% Marcaine in a 50/50 mixture with 10 unites/ml of Hylenex given as a peribulbar Anesthesiologist: Anesthesiologist: Elyse Hsu, MD CRNA: Demetrius Charity, CRNA Complications: none Estimated Blood Loss: less than 1 ml  Description of procedure:  The patient was given anesthesia and sedation via intravenous access. The patient was then prepped and draped in the usual fashion. A 25-gauge needle was bent for initiating the capsulorhexis. A 5-0 silk suture was placed through the conjunctiva superior and inferiorly to serve as bridle sutures. Hemostasis was obtained at the superior limbus using an eraser cautery. A partial thickness groove was made at the anterior surgical limbus with a 64 Beaver blade and this was dissected anteriorly with an Avaya. The anterior chamber was entered at 10 o'clock with a 1.0 mm paracentesis knife and through the lamellar dissection with a 2.6 mm Alcon keratome. Epi-Shugarcaine 0.5 CC [9 cc BSS Plus (Alcon), 3 cc 4% preservative-free lidocaine ([place]) and 4 cc 1:1000 preservative-free, bisulfite-free epinephrine] was injected via the paracentesis tract. DiscoVisc was injected to replace the aqueous and a continuous tear curvilinear capsulorhexis was performed using a bent 25-gauge needle.  Balance salt on a syringe was used to perform hydro-dissection and phacoemulsification was carried out using a divide and conquer technique. Procedure(s) with  comments: CATARACT EXTRACTION PHACO AND INTRAOCULAR LENS PLACEMENT ([facility]) (Left) - [place]: 01:05.6 AP%: 25.7 CDE: 29.05 Lot # [id number]. Irrigation/aspiration was used to remove the residual cortex and the capsular bag was inflated with DiscoVisc. The intraocular lens was inserted into the capsular bag using a pre-loaded UltraSert Delivery System. Irrigation/aspiration was used to remove the residual DiscoVisc. The wound was inflated with balanced salt and checked for leaks. None were found. Miostat was injected via the paracentesis track and 0.1 ml of Vigamox containing 1 mg of drug  was injected via the paracentesis track. The wound was checked for leaks again and none were found.   The bridal sutures were removed and two drops of Vigamox were placed on the eye. An eye shield was placed to protect the eye and the patient was discharged to the recovery area in good condition.   Tamari Busic MD

## 2014-09-19 NOTE — H&P (Signed)
  See scanned notes. 

## 2014-09-19 NOTE — Anesthesia Postprocedure Evaluation (Signed)
  Anesthesia Post-op Note  Patient: Kelli [name]  Procedure(s) Performed: Procedure(s) with comments: CATARACT EXTRACTION PHACO AND INTRAOCULAR LENS PLACEMENT ([facility]) (Left) - [place]: 01:05.6 AP%: 25.7 CDE: 29.05 Lot # [id number]  Anesthesia type:MAC  Patient location: PACU  Post pain: Pain level controlled  Post assessment: Post-op Vital signs reviewed, Patient's Cardiovascular Status Stable, Respiratory Function Stable, Patent Airway and No signs of Nausea or vomiting  Post vital signs: Reviewed and stable  Last Vitals:  Filed Vitals:   09/19/14 0939  BP: 118/50  Pulse: 68  Temp: 36.1 C  Resp: 16    Level of consciousness: awake, alert  and patient cooperative  Complications: No apparent anesthesia complications

## 2014-12-07 ENCOUNTER — Other Ambulatory Visit: Payer: Self-pay | Admitting: Family Medicine

## 2014-12-07 DIAGNOSIS — Z1231 Encounter for screening mammogram for malignant neoplasm of breast: Secondary | ICD-10-CM

## 2014-12-19 ENCOUNTER — Other Ambulatory Visit: Payer: Self-pay | Admitting: Orthopedic Surgery

## 2014-12-19 DIAGNOSIS — M75101 Unspecified rotator cuff tear or rupture of right shoulder, not specified as traumatic: Secondary | ICD-10-CM

## 2014-12-19 DIAGNOSIS — M75102 Unspecified rotator cuff tear or rupture of left shoulder, not specified as traumatic: Secondary | ICD-10-CM

## 2014-12-26 ENCOUNTER — Ambulatory Visit
Admission: RE | Admit: 2014-12-26 | Discharge: 2014-12-26 | Disposition: A | Discharge status: Home / Self Care | Payer: Medicare PPO | Source: Ambulatory Visit | Attending: Family Medicine | Admitting: Family Medicine

## 2014-12-26 ENCOUNTER — Other Ambulatory Visit: Payer: Self-pay | Admitting: Family Medicine

## 2014-12-26 DIAGNOSIS — Z1231 Encounter for screening mammogram for malignant neoplasm of breast: Secondary | ICD-10-CM | POA: Diagnosis present

## 2015-01-06 ENCOUNTER — Ambulatory Visit
Admission: RE | Admit: 2015-01-06 | Discharge: 2015-01-06 | Disposition: A | Discharge status: Home / Self Care | Payer: Medicare PPO | Source: Ambulatory Visit | Attending: Orthopedic Surgery | Admitting: Orthopedic Surgery

## 2015-01-06 DIAGNOSIS — M25512 Pain in left shoulder: Secondary | ICD-10-CM | POA: Diagnosis present

## 2015-01-06 DIAGNOSIS — M75101 Unspecified rotator cuff tear or rupture of right shoulder, not specified as traumatic: Secondary | ICD-10-CM | POA: Diagnosis present

## 2015-01-06 DIAGNOSIS — M25511 Pain in right shoulder: Secondary | ICD-10-CM | POA: Diagnosis present

## 2015-01-06 DIAGNOSIS — M19012 Primary osteoarthritis, left shoulder: Secondary | ICD-10-CM | POA: Diagnosis not present

## 2015-01-06 DIAGNOSIS — M75121 Complete rotator cuff tear or rupture of right shoulder, not specified as traumatic: Secondary | ICD-10-CM | POA: Insufficient documentation

## 2015-01-06 DIAGNOSIS — M75102 Unspecified rotator cuff tear or rupture of left shoulder, not specified as traumatic: Secondary | ICD-10-CM

## 2015-01-06 DIAGNOSIS — S43431A Superior glenoid labrum lesion of right shoulder, initial encounter: Secondary | ICD-10-CM | POA: Insufficient documentation

## 2015-02-27 DIAGNOSIS — M75121 Complete rotator cuff tear or rupture of right shoulder, not specified as traumatic: Secondary | ICD-10-CM | POA: Insufficient documentation

## 2015-02-27 DIAGNOSIS — M7582 Other shoulder lesions, left shoulder: Secondary | ICD-10-CM | POA: Insufficient documentation

## 2015-03-13 ENCOUNTER — Ambulatory Visit (INDEPENDENT_AMBULATORY_CARE_PROVIDER_SITE_OTHER): Payer: Medicare Other | Admitting: Podiatry

## 2015-03-13 ENCOUNTER — Encounter: Payer: Self-pay | Admitting: Podiatry

## 2015-03-13 DIAGNOSIS — B351 Tinea unguium: Secondary | ICD-10-CM

## 2015-03-13 DIAGNOSIS — M79676 Pain in unspecified toe(s): Secondary | ICD-10-CM | POA: Diagnosis not present

## 2015-03-13 DIAGNOSIS — Q828 Other specified congenital malformations of skin: Secondary | ICD-10-CM | POA: Diagnosis not present

## 2015-03-13 DIAGNOSIS — M722 Plantar fascial fibromatosis: Secondary | ICD-10-CM | POA: Diagnosis not present

## 2015-03-13 NOTE — Progress Notes (Signed)
She presents today for follow-up of her plantar fasciitis is ALSO to have her nails and calluses trimmed. She states that the injection last time did not help at all.  Objective: Vital signs are stable she is alert and oriented 3. Pulses are palpable. Toenails are thick yellow dystrophic likely mycotic and reactive hyperkeratotic lesions presence of fourth metatarsal head of the right foot and subsecond metatarsal head of the left foot. She has pain on palpation medial calcaneal tubercle of the left heel.  Assessment: Plantar fasciitis left. Pain in limb secondary to onychomycosis and porokeratosis.  Plan: Debrided all reactive hyperkeratosis and toenails bilateral. I also had her scan for set of orthotics.

## 2015-03-15 NOTE — Patient Instructions (Signed)

## 2015-04-14 ENCOUNTER — Ambulatory Visit (INDEPENDENT_AMBULATORY_CARE_PROVIDER_SITE_OTHER): Payer: Medicare Other | Admitting: *Deleted

## 2015-04-14 DIAGNOSIS — M722 Plantar fascial fibromatosis: Secondary | ICD-10-CM

## 2015-04-14 NOTE — Progress Notes (Signed)
Dispensed patient's orthotics with oral and written instructions for wearing. Patient will follow up with Dr. Hyatt in 1 month for an orthotic check. 

## 2015-05-10 DIAGNOSIS — M1711 Unilateral primary osteoarthritis, right knee: Secondary | ICD-10-CM | POA: Insufficient documentation

## 2015-05-29 ENCOUNTER — Encounter: Payer: Self-pay | Admitting: Podiatry

## 2015-05-29 ENCOUNTER — Ambulatory Visit (INDEPENDENT_AMBULATORY_CARE_PROVIDER_SITE_OTHER): Payer: Medicare Other | Admitting: Podiatry

## 2015-05-29 DIAGNOSIS — M79676 Pain in unspecified toe(s): Secondary | ICD-10-CM | POA: Diagnosis not present

## 2015-05-29 DIAGNOSIS — Q828 Other specified congenital malformations of skin: Secondary | ICD-10-CM

## 2015-05-29 DIAGNOSIS — B351 Tinea unguium: Secondary | ICD-10-CM | POA: Diagnosis not present

## 2015-05-29 DIAGNOSIS — M722 Plantar fascial fibromatosis: Secondary | ICD-10-CM

## 2015-05-31 NOTE — Progress Notes (Signed)
She presents today for a follow-up of her orthotics. As well as her painful toenails and calluses. She would like to have a metatarsal pad placed behind her metatarsals to help hold the heads of the metatarsals off the ground.  Objective: Vital signs are stable she is alert and oriented 3. Pulses are palpable. Toenails are thick yellow dystrophic onychomycotic and painful palpation. She has multiple porokeratosis distal aspect of the toes as well as plantar aspect of the forefoot.  Assessment: Pain and limp secondary to onychomycosis and porokeratosis.  Plan: Debridement of toenails bilateral. Debridement of all reactive hyperkeratosis. We will send her orthotics out for him at pad to be placed.

## 2015-07-31 ENCOUNTER — Encounter
Admission: RE | Admit: 2015-07-31 | Discharge: 2015-07-31 | Disposition: A | Discharge status: Home / Self Care | Payer: Medicare Other | Source: Ambulatory Visit | Attending: Surgery | Admitting: Surgery

## 2015-07-31 DIAGNOSIS — Z01818 Encounter for other preprocedural examination: Secondary | ICD-10-CM | POA: Diagnosis not present

## 2015-07-31 HISTORY — DX: Dermatitis, unspecified: L30.9

## 2015-07-31 HISTORY — DX: Diverticulosis of intestine, part unspecified, without perforation or abscess without bleeding: K57.90

## 2015-07-31 LAB — CBC
HEMATOCRIT: 35 % (ref 35.0–47.0)
Hemoglobin: 11.8 g/dL — ABNORMAL LOW (ref 12.0–16.0)
MCH: 29.3 pg (ref 26.0–34.0)
MCHC: 33.8 g/dL (ref 32.0–36.0)
MCV: 86.6 fL (ref 80.0–100.0)
Platelets: 186 10*3/uL (ref 150–440)
RBC: 4.04 MIL/uL (ref 3.80–5.20)
RDW: 13.8 % (ref 11.5–14.5)
WBC: 10 10*3/uL (ref 3.6–11.0)

## 2015-07-31 LAB — DIFFERENTIAL
BASOS ABS: 0.1 10*3/uL (ref 0–0.1)
BASOS PCT: 1 %
EOS ABS: 0.5 10*3/uL (ref 0–0.7)
Eosinophils Relative: 5 %
Lymphocytes Relative: 16 %
Lymphs Abs: 1.6 10*3/uL (ref 1.0–3.6)
MONO ABS: 0.7 10*3/uL (ref 0.2–0.9)
MONOS PCT: 7 %
Neutro Abs: 7.2 10*3/uL — ABNORMAL HIGH (ref 1.4–6.5)
Neutrophils Relative %: 71 %

## 2015-07-31 NOTE — Patient Instructions (Signed)
  Your procedure is scheduled [id number] 18, 2017 (Tuesday) Report to [facility] Day [facility] 2nd floor [facility] To find out your arrival time please call [phone] between 1PM - 3PM on August 07, 2015 (Monday)  Remember: Instructions that are not followed completely may result in serious medical risk, up to and including death, or upon the discretion of your surgeon and anesthesiologist your surgery may need to be rescheduled.    _x___ 1. Do not eat food or drink liquids after midnight. No gum chewing or hard candies.     __x__ 2. No Alcohol for 24 hours before or after surgery.   __x__3. No Smoking for 24 prior to surgery.   ____  4. Bring all medications with you on the day of surgery if instructed.    __x__ 5. Notify your doctor if there is any change in your medical condition     (cold, fever, infections).     Do not wear jewelry, make-up, hairpins, clips or nail polish.  Do not wear lotions, powders, or perfumes. You may wear deodorant.  Do not shave 48 hours prior to surgery. Men may shave face and neck.  Do not bring valuables to the hospital.    [facility] is not responsible for any belongings or valuables.               Contacts, dentures or bridgework may not be worn into surgery.  Leave your suitcase in the car. After surgery it may be brought to your room.  For patients admitted to the hospital, discharge time is determined by your treatment team.   Patients discharged the day of surgery will not be allowed to drive home.    Please read over the following fact sheets that you were given:   [facility] Preparing for Surgery and or MRSA Information   _x___ Take these medicines the morning of surgery with A SIP OF WATER:    1. Gabapentin  2.  3.  4.  5.  6.  ____ Fleet Enema (as directed)   _x___ Use CHG Soap or sage wipes as directed on instruction sheet   ____ Use inhalers on the day of surgery and bring to hospital day of surgery  ____ Stop metformin 2 days  prior to surgery    ____ Take 1/2 of usual insulin dose the night before surgery and none on the morning of  surgey         _x___ Stop aspirin or coumadin, or plavix (N/A)  _x__ Stop Anti-inflammatories such as Advil, Aleve, Ibuprofen, Motrin, Naproxen,          Naprosyn, Goodies powders or aspirin products. Ok to take Tylenol.   _x___ Stop supplements until after surgery.  (STOP VITAMIN B COMPLEX NOW)  ____ Bring C-Pap to the hospital.

## 2015-08-08 ENCOUNTER — Ambulatory Visit
Admission: RE | Admit: 2015-08-08 | Discharge: 2015-08-08 | Disposition: A | Discharge status: Home / Self Care | Payer: Medicare Other | Source: Ambulatory Visit | Attending: Surgery | Admitting: Surgery

## 2015-08-08 ENCOUNTER — Ambulatory Visit: Payer: Medicare Other | Admitting: Anesthesiology

## 2015-08-08 ENCOUNTER — Encounter: Payer: Self-pay | Admitting: *Deleted

## 2015-08-08 ENCOUNTER — Encounter
Admission: RE | Disposition: A | Discharge status: Home / Self Care | Payer: Self-pay | Source: Ambulatory Visit | Attending: Surgery

## 2015-08-08 DIAGNOSIS — Z79899 Other long term (current) drug therapy: Secondary | ICD-10-CM | POA: Insufficient documentation

## 2015-08-08 DIAGNOSIS — Z8042 Family history of malignant neoplasm of prostate: Secondary | ICD-10-CM | POA: Insufficient documentation

## 2015-08-08 DIAGNOSIS — Z885 Allergy status to narcotic agent status: Secondary | ICD-10-CM | POA: Insufficient documentation

## 2015-08-08 DIAGNOSIS — Z882 Allergy status to sulfonamides status: Secondary | ICD-10-CM | POA: Insufficient documentation

## 2015-08-08 DIAGNOSIS — M7541 Impingement syndrome of right shoulder: Secondary | ICD-10-CM | POA: Diagnosis not present

## 2015-08-08 DIAGNOSIS — Z90710 Acquired absence of both cervix and uterus: Secondary | ICD-10-CM | POA: Insufficient documentation

## 2015-08-08 DIAGNOSIS — M75101 Unspecified rotator cuff tear or rupture of right shoulder, not specified as traumatic: Secondary | ICD-10-CM | POA: Diagnosis present

## 2015-08-08 DIAGNOSIS — Z9049 Acquired absence of other specified parts of digestive tract: Secondary | ICD-10-CM | POA: Insufficient documentation

## 2015-08-08 DIAGNOSIS — Z888 Allergy status to other drugs, medicaments and biological substances status: Secondary | ICD-10-CM | POA: Insufficient documentation

## 2015-08-08 DIAGNOSIS — L409 Psoriasis, unspecified: Secondary | ICD-10-CM | POA: Insufficient documentation

## 2015-08-08 DIAGNOSIS — E892 Postprocedural hypoparathyroidism: Secondary | ICD-10-CM | POA: Diagnosis not present

## 2015-08-08 DIAGNOSIS — M19011 Primary osteoarthritis, right shoulder: Secondary | ICD-10-CM | POA: Diagnosis not present

## 2015-08-08 DIAGNOSIS — Z8249 Family history of ischemic heart disease and other diseases of the circulatory system: Secondary | ICD-10-CM | POA: Insufficient documentation

## 2015-08-08 DIAGNOSIS — M810 Age-related osteoporosis without current pathological fracture: Secondary | ICD-10-CM | POA: Insufficient documentation

## 2015-08-08 DIAGNOSIS — Z803 Family history of malignant neoplasm of breast: Secondary | ICD-10-CM | POA: Insufficient documentation

## 2015-08-08 DIAGNOSIS — M75121 Complete rotator cuff tear or rupture of right shoulder, not specified as traumatic: Secondary | ICD-10-CM | POA: Diagnosis not present

## 2015-08-08 DIAGNOSIS — Z881 Allergy status to other antibiotic agents status: Secondary | ICD-10-CM | POA: Insufficient documentation

## 2015-08-08 HISTORY — PX: SHOULDER ARTHROSCOPY WITH OPEN ROTATOR CUFF REPAIR: SHX6092

## 2015-08-08 SURGERY — ARTHROSCOPY, SHOULDER WITH REPAIR, ROTATOR CUFF, OPEN
Anesthesia: Regional | Laterality: Right | Wound class: Clean

## 2015-08-08 MED ORDER — LACTATED RINGERS IV SOLN
INTRAVENOUS | Status: DC
  Administered 2015-08-08: 10:00:00 via INTRAVENOUS

## 2015-08-08 MED ORDER — ACETAMINOPHEN 10 MG/ML IV SOLN
INTRAVENOUS | Status: AC
  Filled 2015-08-08: qty 100

## 2015-08-08 MED ORDER — OXYCODONE-ACETAMINOPHEN 2.5-325 MG PO TABS: 1.0000 | ORAL_TABLET | ORAL | Status: DC | PRN

## 2015-08-08 MED ORDER — PROMETHAZINE HCL 25 MG/ML IJ SOLN: 6.2500 mg | INTRAMUSCULAR | Status: DC | PRN

## 2015-08-08 MED ORDER — VANCOMYCIN HCL IN DEXTROSE 1-5 GM/200ML-% IV SOLN
INTRAVENOUS | Status: AC
  Filled 2015-08-08: qty 200

## 2015-08-08 MED ORDER — ROPIVACAINE HCL 5 MG/ML IJ SOLN
INTRAMUSCULAR | Status: AC
  Filled 2015-08-08: qty 40

## 2015-08-08 MED ORDER — FENTANYL CITRATE (PF) 100 MCG/2ML IJ SOLN
INTRAMUSCULAR | Status: DC
Start: 2015-08-08 — End: 2015-08-08
  Filled 2015-08-08: qty 2

## 2015-08-08 MED ORDER — LIDOCAINE HCL (CARDIAC) 20 MG/ML IV SOLN
INTRAVENOUS | Status: DC | PRN
  Administered 2015-08-08: 70 mg via INTRAVENOUS

## 2015-08-08 MED ORDER — VANCOMYCIN HCL IN DEXTROSE 1-5 GM/200ML-% IV SOLN
1000.0000 mg | Freq: Once | INTRAVENOUS | Status: AC
  Administered 2015-08-08: 1000 mg via INTRAVENOUS

## 2015-08-08 MED ORDER — FENTANYL CITRATE (PF) 100 MCG/2ML IJ SOLN
25.0000 ug | Freq: Once | INTRAMUSCULAR | Status: AC
  Administered 2015-08-08: 25 ug via INTRAVENOUS

## 2015-08-08 MED ORDER — PROPOFOL 10 MG/ML IV BOLUS
INTRAVENOUS | Status: DC | PRN
  Administered 2015-08-08: 100 mg via INTRAVENOUS

## 2015-08-08 MED ORDER — OXYCODONE HCL 5 MG/5ML PO SOLN: 5.0000 mg | Freq: Once | ORAL | Status: DC | PRN

## 2015-08-08 MED ORDER — NEOSTIGMINE METHYLSULFATE 10 MG/10ML IV SOLN
INTRAVENOUS | Status: DC | PRN
  Administered 2015-08-08: 3 mg via INTRAVENOUS

## 2015-08-08 MED ORDER — ACETAMINOPHEN 10 MG/ML IV SOLN
INTRAVENOUS | Status: DC | PRN
  Administered 2015-08-08: 1000 mg via INTRAVENOUS

## 2015-08-08 MED ORDER — BUPIVACAINE-EPINEPHRINE (PF) 0.5% -1:200000 IJ SOLN
INTRAMUSCULAR | Status: AC
  Filled 2015-08-08: qty 30

## 2015-08-08 MED ORDER — MEPERIDINE HCL 25 MG/ML IJ SOLN: 6.2500 mg | INTRAMUSCULAR | Status: DC | PRN

## 2015-08-08 MED ORDER — DEXTROSE 5 % IV SOLN
10.0000 mg | INTRAVENOUS | Status: DC | PRN
  Administered 2015-08-08: 30 ug/min via INTRAVENOUS

## 2015-08-08 MED ORDER — EPINEPHRINE HCL 1 MG/ML IJ SOLN
INTRAMUSCULAR | Status: AC
  Filled 2015-08-08: qty 2

## 2015-08-08 MED ORDER — OXYCODONE HCL 5 MG PO TABS: 5.0000 mg | ORAL_TABLET | Freq: Once | ORAL | Status: DC | PRN

## 2015-08-08 MED ORDER — GLYCOPYRROLATE 0.2 MG/ML IJ SOLN
INTRAMUSCULAR | Status: DC | PRN
  Administered 2015-08-08: 0.4 mg via INTRAVENOUS

## 2015-08-08 MED ORDER — ONDANSETRON HCL 4 MG/2ML IJ SOLN
INTRAMUSCULAR | Status: DC | PRN
  Administered 2015-08-08: 4 mg via INTRAVENOUS

## 2015-08-08 MED ORDER — FENTANYL CITRATE (PF) 100 MCG/2ML IJ SOLN: 25.0000 ug | INTRAMUSCULAR | Status: DC | PRN

## 2015-08-08 MED ORDER — BUPIVACAINE-EPINEPHRINE 0.5% -1:200000 IJ SOLN
INTRAMUSCULAR | Status: DC | PRN
  Administered 2015-08-08: 30 mL

## 2015-08-08 MED ORDER — ROCURONIUM BROMIDE 100 MG/10ML IV SOLN
INTRAVENOUS | Status: DC | PRN
  Administered 2015-08-08: 40 mg via INTRAVENOUS

## 2015-08-08 MED ORDER — EPINEPHRINE HCL 1 MG/ML IJ SOLN
INTRAMUSCULAR | Status: DC | PRN
Start: 2015-08-08 — End: 2015-08-08
  Administered 2015-08-08: 2 mL

## 2015-08-08 MED ORDER — FAMOTIDINE 20 MG PO TABS
20.0000 mg | ORAL_TABLET | Freq: Once | ORAL | Status: AC
  Administered 2015-08-08: 20 mg via ORAL

## 2015-08-08 MED ORDER — DEXAMETHASONE SODIUM PHOSPHATE 4 MG/ML IJ SOLN
INTRAMUSCULAR | Status: DC | PRN
  Administered 2015-08-08: 5 mg via INTRAVENOUS

## 2015-08-08 MED ORDER — FENTANYL CITRATE (PF) 100 MCG/2ML IJ SOLN
INTRAMUSCULAR | Status: DC | PRN
  Administered 2015-08-08: 150 ug via INTRAVENOUS
  Administered 2015-08-08: 100 ug via INTRAVENOUS

## 2015-08-08 MED ORDER — MIDAZOLAM HCL 5 MG/5ML IJ SOLN
INTRAMUSCULAR | Status: AC
  Filled 2015-08-08: qty 5

## 2015-08-08 MED ORDER — FAMOTIDINE 20 MG PO TABS
ORAL_TABLET | ORAL | Status: AC
  Filled 2015-08-08: qty 1

## 2015-08-08 MED ORDER — ROPIVACAINE HCL 5 MG/ML IJ SOLN
INTRAMUSCULAR | Status: DC | PRN
  Administered 2015-08-08: 30 mL via PERINEURAL

## 2015-08-08 MED ORDER — LIDOCAINE HCL (PF) 1 % IJ SOLN
INTRAMUSCULAR | Status: AC
  Filled 2015-08-08: qty 5

## 2015-08-08 SURGICAL SUPPLY — 46 items
ANCHOR JUGGERKNOT WTAP NDL 2.9 (Anchor) ×6 IMPLANT
BIT DRILL JUGRKNT W/NDL BIT2.9 (DRILL) ×1 IMPLANT
BLADE FULL RADIUS 3.5 (BLADE) ×3 IMPLANT
BLADE SHAVER 4.5X7 STR FR (MISCELLANEOUS) IMPLANT
BUR ACROMIONIZER 4.0 (BURR) ×3 IMPLANT
BUR BR 5.5 WIDE MOUTH (BURR) IMPLANT
CANNULA SHAVER 8MMX76MM (CANNULA) ×3 IMPLANT
CHLORAPREP W/TINT 26ML (MISCELLANEOUS) ×6 IMPLANT
COVER MAYO STAND STRL (DRAPES) ×3 IMPLANT
DRAPE IMP U-DRAPE 54X76 (DRAPES) ×6 IMPLANT
DRILL JUGGERKNOT W/NDL BIT 2.9 (DRILL) ×3
DRSG OPSITE POSTOP 4X8 (GAUZE/BANDAGES/DRESSINGS) ×3 IMPLANT
ELECT REM PT RETURN 9FT ADLT (ELECTROSURGICAL) ×3
ELECTRODE REM PT RTRN 9FT ADLT (ELECTROSURGICAL) ×1 IMPLANT
GAUZE PETRO XEROFOAM 1X8 (MISCELLANEOUS) ×3 IMPLANT
GAUZE SPONGE 4X4 12PLY STRL (GAUZE/BANDAGES/DRESSINGS) ×3 IMPLANT
GLOVE BIO SURGEON STRL SZ7.5 (GLOVE) ×6 IMPLANT
GLOVE BIO SURGEON STRL SZ8 (GLOVE) ×6 IMPLANT
GLOVE BIOGEL PI IND STRL 8 (GLOVE) ×1 IMPLANT
GLOVE BIOGEL PI INDICATOR 8 (GLOVE) ×2
GLOVE INDICATOR 8.0 STRL GRN (GLOVE) ×3 IMPLANT
GOWN STRL REUS W/ TWL LRG LVL3 (GOWN DISPOSABLE) ×2 IMPLANT
GOWN STRL REUS W/ TWL XL LVL3 (GOWN DISPOSABLE) ×1 IMPLANT
GOWN STRL REUS W/TWL LRG LVL3 (GOWN DISPOSABLE) ×4
GOWN STRL REUS W/TWL XL LVL3 (GOWN DISPOSABLE) ×2
GRASPER SUT 15 45D LOW PRO (SUTURE) IMPLANT
IV LACTATED RINGER IRRG 3000ML (IV SOLUTION) ×4
IV LR IRRIG 3000ML ARTHROMATIC (IV SOLUTION) ×2 IMPLANT
MANIFOLD NEPTUNE II (INSTRUMENTS) ×3 IMPLANT
MASK FACE SPIDER DISP (MASK) ×3 IMPLANT
MAT BLUE FLOOR 46X72 FLO (MISCELLANEOUS) ×3 IMPLANT
NDL FRENCH SPRG EYE 1/2 CRC (NEEDLE) ×3 IMPLANT
NEEDLE REVERSE CUT 1/2 CRC (NEEDLE) IMPLANT
PACK ARTHROSCOPY SHOULDER (MISCELLANEOUS) ×3 IMPLANT
SLING ARM LRG DEEP (SOFTGOODS) ×3 IMPLANT
SLING ULTRA II LG (MISCELLANEOUS) ×3 IMPLANT
STAPLER SKIN PROX 35W (STAPLE) ×3 IMPLANT
STRAP SAFETY BODY (MISCELLANEOUS) ×3 IMPLANT
SUT ETHIBOND 0 MO6 C/R (SUTURE) ×3 IMPLANT
SUT VIC AB 2-0 CT1 27 (SUTURE) ×4
SUT VIC AB 2-0 CT1 TAPERPNT 27 (SUTURE) ×2 IMPLANT
TAPE MICROFOAM 4IN (TAPE) ×3 IMPLANT
TUBING ARTHRO INFLOW-ONLY STRL (TUBING) ×3 IMPLANT
TUBING CONNECTING 10 (TUBING) ×2 IMPLANT
TUBING CONNECTING 10' (TUBING) ×1
WAND HAND CNTRL MULTIVAC 90 (MISCELLANEOUS) ×3 IMPLANT

## 2015-08-08 NOTE — Discharge Instructions (Addendum)
Keep dressing dry and intact.  May shower after dressing changed on post-op day #4 (Saturday).  Cover staples with Band-Aids after drying off. Apply ice frequently to shoulder. Keep shoulder immobilizer on at all times except may remove for bathing purposes. Follow-up in 10-14 days or as scheduled.  AMBULATORY SURGERY  DISCHARGE INSTRUCTIONS   1) The drugs that you were given will stay in your system until tomorrow so for the next 24 hours you should not:  A) Drive an automobile B) Make any legal decisions C) Drink any alcoholic beverage   2) You may resume regular meals tomorrow.  Today it is better to start with liquids and gradually work up to solid foods.  You may eat anything you prefer, but it is better to start with liquids, then soup and crackers, and gradually work up to solid foods.   3) Please notify your doctor immediately if you have any unusual bleeding, trouble breathing, redness and pain at the surgery site, drainage, fever, or pain not relieved by medication.    4) Additional Instructions:        Please contact your physician with any problems or Same Day Surgery at [phone], Monday through Friday 6 am to 4 pm, or [place] at [facility] number at [phone].
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
## 2015-08-08 NOTE — Anesthesia Postprocedure Evaluation (Signed)
Anesthesia Post Note  Patient: TAUNIA HENEHAN  Procedure(s) Performed: Procedure(s) (LRB): SHOULDER ARTHROSCOPY WITH OPEN ROTATOR CUFF REPAIR (Right)  Patient location during evaluation: PACU Anesthesia Type: General Level of consciousness: awake and alert and oriented Pain management: pain level controlled Vital Signs Assessment: post-procedure vital signs reviewed and stable Respiratory status: spontaneous breathing, nonlabored ventilation and respiratory function stable Cardiovascular status: blood pressure returned to baseline and stable Postop Assessment: no signs of nausea or vomiting Anesthetic complications: no    Last Vitals:  Filed Vitals:   08/08/15 1331 08/08/15 1346  BP: 100/57 100/70  Pulse: 62 60  Temp:    Resp: 12 15    Last Pain:  Filed Vitals:   08/08/15 1346  PainSc: 0-No pain                 Marcell Pfeifer

## 2015-08-08 NOTE — Progress Notes (Signed)
Still no pain on discharge

## 2015-08-08 NOTE — Progress Notes (Signed)
Pt. Taken to [facility] for nerve block.  Report given to [facility].

## 2015-08-08 NOTE — Anesthesia Preprocedure Evaluation (Signed)
Anesthesia Evaluation  Patient identified by MRN, date of birth, ID band Patient awake    Reviewed: Allergy & Precautions, NPO status , Patient's Chart, lab work & pertinent test results  History of Anesthesia Complications Negative for: history of anesthetic complications  Airway Mallampati: II  TM Distance: >3 FB Neck ROM: Full    Dental no notable dental hx.    Pulmonary neg COPD,    Pulmonary exam normal breath sounds clear to auscultation- rhonchi (-) wheezing      Cardiovascular Exercise Tolerance: Good (-) hypertension(-) CAD and (-) Past MI Normal cardiovascular exam Rhythm:Regular Rate:Normal - Systolic murmurs    Neuro/Psych  Neuromuscular disease negative psych ROS   GI/Hepatic negative GI ROS, Neg liver ROS,   Endo/Other  neg diabetes  Renal/GU Renal disease (renal cyst, being followed)     Musculoskeletal  (+) Arthritis , Osteoarthritis,    Abdominal Normal abdominal exam  (+)   Peds  Hematology negative hematology ROS (+)   Anesthesia Other Findings   Reproductive/Obstetrics                             Anesthesia Physical Anesthesia Plan  ASA: II  Anesthesia Plan: General and Regional   Post-op Pain Management: GA combined w/ Regional for post-op pain   Induction: Intravenous  Airway Management Planned: Oral ETT  Additional Equipment:   Intra-op Plan:   Post-operative Plan: Extubation in OR  Informed Consent: I have reviewed the patients History and Physical, chart, labs and discussed the procedure including the risks, benefits and alternatives for the proposed anesthesia with the patient or authorized representative who has indicated his/her understanding and acceptance.   Dental advisory given  Plan Discussed with:   Anesthesia Plan Comments:         Anesthesia Quick Evaluation

## 2015-08-08 NOTE — H&P (Signed)
Paper H&P to be scanned into permanent record. H&P reviewed. No changes. 

## 2015-08-08 NOTE — Progress Notes (Signed)
Discharged with sling intact

## 2015-08-08 NOTE — Transfer of Care (Signed)
Immediate Anesthesia Transfer of Care Note  Patient: Kelli [name]  Procedure(s) Performed: Procedure(s): SHOULDER ARTHROSCOPY WITH OPEN ROTATOR CUFF REPAIR (Right)  Patient Location: PACU  Anesthesia Type:General  Level of Consciousness: sedated  Airway & Oxygen Therapy: Patient Spontanous Breathing and Patient connected to nasal cannula oxygen  Post-op Assessment: Report given to RN and Post -op Vital signs reviewed and stable  Post vital signs: Reviewed and stable  Last Vitals:  Filed Vitals:   08/08/15 1047 08/08/15 1301  BP: 121/56 105/61  Pulse: 75 75  Temp:  36.1 C  Resp: 17 20    Last Pain: There were no vitals filed for this visit.       Complications: No apparent anesthesia complications

## 2015-08-08 NOTE — Anesthesia Procedure Notes (Addendum)
Anesthesia Regional Block:  Interscalene brachial plexus block  Pre-Anesthetic Checklist: ,, timeout performed, Correct Patient, Correct Site, Correct Laterality, Correct Procedure, Correct Position, site marked, Risks and benefits discussed,  Surgical consent,  Pre-op evaluation,  At surgeon's request and post-op pain management  Laterality: Right  Prep: chloraprep       Needles:  Injection technique: Single-shot  Needle Type: Stimiplex     Needle Length: 5cm 5 cm Needle Gauge: 22 and 22 G    Additional Needles:  Procedures: ultrasound guided (picture in chart) Interscalene brachial plexus block Narrative:  Start time: 08/08/2015 10:00 AM End time: 08/08/2015 10:18 AM Injection made incrementally with aspirations every 5 mL.  Performed by: Personally  Anesthesiologist: PENWARDEN, AMY  Additional Notes: Functioning IV was confirmed and monitors were applied.  A 43mm 22ga Stimuplex needle was used. Sterile prep and drape,hand hygiene and sterile gloves were used.  Negative aspiration and negative test dose prior to incremental administration of local anesthetic. The patient tolerated the procedure well.      Procedure Name: Intubation Date/Time: 08/08/2015 11:19 AM Performed by: Rosaria Ferries, Jourdan Maldonado Pre-anesthesia Checklist: Patient identified, Emergency Drugs available, Suction available and Patient being monitored Patient Re-evaluated:Patient Re-evaluated prior to inductionOxygen Delivery Method: Circle system utilized Preoxygenation: Pre-oxygenation with 100% oxygen Intubation Type: IV induction Laryngoscope Size: Mac and 3 Grade View: Grade I Tube type: Oral Tube size: 7.0 mm Number of attempts: 1 Placement Confirmation: ETT inserted through vocal cords under direct vision,  positive ETCO2 and breath sounds checked- equal and bilateral Secured at: 21 cm Tube secured with: Tape Dental Injury: Teeth and Oropharynx as per pre-operative assessment

## 2015-08-23 ENCOUNTER — Ambulatory Visit (INDEPENDENT_AMBULATORY_CARE_PROVIDER_SITE_OTHER): Payer: Medicare Other | Admitting: Podiatry

## 2015-08-23 DIAGNOSIS — M722 Plantar fascial fibromatosis: Secondary | ICD-10-CM

## 2015-08-23 DIAGNOSIS — Q828 Other specified congenital malformations of skin: Secondary | ICD-10-CM

## 2015-08-30 ENCOUNTER — Ambulatory Visit: Payer: Medicare Other | Admitting: Podiatry

## 2015-09-01 NOTE — Progress Notes (Signed)
Patient presents today to pick up adjusted right orthotic. Kelli [name], CPED, evaluated fit. Patient was unhappy about the met pad that was added was not big enough and did not cover her forefoot. Pictures were taken of her old pair to show how the met pad covered the forefoot area. New orthotic was sent back for adjustment.  Will contact pt once the orthotic arrives in office

## 2015-10-02 DIAGNOSIS — M353 Polymyalgia rheumatica: Secondary | ICD-10-CM | POA: Insufficient documentation

## 2015-11-15 ENCOUNTER — Ambulatory Visit (INDEPENDENT_AMBULATORY_CARE_PROVIDER_SITE_OTHER): Payer: Medicare Other | Admitting: Podiatry

## 2015-11-15 VITALS — BP 134/83 | HR 89 | Resp 16

## 2015-11-15 DIAGNOSIS — B351 Tinea unguium: Secondary | ICD-10-CM | POA: Diagnosis not present

## 2015-11-15 DIAGNOSIS — M79676 Pain in unspecified toe(s): Secondary | ICD-10-CM

## 2015-11-15 DIAGNOSIS — Q828 Other specified congenital malformations of skin: Secondary | ICD-10-CM

## 2015-11-15 NOTE — Progress Notes (Signed)
She presents today with a chief complaint painful elongated toenails bilaterally. An plantar fasciitis left foot.  Objective: Vital signs are stable she is alert and oriented 3 pulses are palpable. Toenails are thick yellow dystrophic length and mycotic reactive hyperkeratosis plantar aspect of bilateral foot pain on palpation medial tubercle of the left heel. No open lesions or wounds are noted.  Assessment: Plantar fasciitis left heel. Pain limb secondary to onychomycosis.  Plan: Place her in a compression anklet for the edema of the left foot and ankle and also in a plantar fascia brace. She will continue use of her orthotics and I debrided toenails 1 through 5 bilateral. Follow up with her in another few months for re-debridement.

## 2015-11-20 ENCOUNTER — Other Ambulatory Visit: Payer: Self-pay | Admitting: Family Medicine

## 2015-11-20 DIAGNOSIS — Z1231 Encounter for screening mammogram for malignant neoplasm of breast: Secondary | ICD-10-CM

## 2015-12-27 ENCOUNTER — Ambulatory Visit
Admission: RE | Admit: 2015-12-27 | Discharge: 2015-12-27 | Disposition: A | Discharge status: Home / Self Care | Payer: Medicare Other | Source: Ambulatory Visit | Attending: Family Medicine | Admitting: Family Medicine

## 2015-12-27 DIAGNOSIS — Z1231 Encounter for screening mammogram for malignant neoplasm of breast: Secondary | ICD-10-CM | POA: Insufficient documentation

## 2016-04-15 ENCOUNTER — Ambulatory Visit: Payer: Medicare Other | Admitting: Podiatry

## 2016-04-29 ENCOUNTER — Ambulatory Visit (INDEPENDENT_AMBULATORY_CARE_PROVIDER_SITE_OTHER): Payer: Medicare Other | Admitting: Podiatry

## 2016-04-29 DIAGNOSIS — M79676 Pain in unspecified toe(s): Secondary | ICD-10-CM | POA: Diagnosis not present

## 2016-04-29 DIAGNOSIS — M722 Plantar fascial fibromatosis: Secondary | ICD-10-CM

## 2016-04-29 DIAGNOSIS — B351 Tinea unguium: Secondary | ICD-10-CM | POA: Diagnosis not present

## 2016-04-29 NOTE — Progress Notes (Signed)
Complaint:  Visit Type: Patient returns to my office for continued preventative foot care services. Complaint: Patient states" my nails have grown long and thick and become painful to walk and wear shoes" Patient states she is having moderate heel pain both feet.  Previously treated with braces and injection therapy. Pain has returned.. The patient presents for preventative foot care services. No changes to ROS  Podiatric Exam: Vascular: dorsalis pedis and posterior tibial pulses are palpable bilateral. Capillary return is immediate. Temperature gradient is WNL. Skin turgor WNL  Sensorium: Normal Semmes Weinstein monofilament test. Normal tactile sensation bilaterally. Nail Exam: Pt has thick disfigured discolored nails with subungual debris noted bilateral entire nail hallux through fifth toenails Ulcer Exam: There is no evidence of ulcer or pre-ulcerative changes or infection. Orthopedic Exam: Muscle tone and strength are WNL. No limitations in general ROM. No crepitus or effusions noted. Foot type and digits show no abnormalities. Bony prominences are unremarkable. Palpable pain at insertion plantar fascia  Bilateral.   Skin: No Porokeratosis. No infection or ulcers  Diagnosis:  Onychomycosis, , Pain in right toe, pain in left toes  Treatment & Plan Procedures and Treatment: Consent by patient was obtained for treatment procedures. The patient understood the discussion of treatment and procedures well. All questions were answered thoroughly reviewed. Debridement of mycotic and hypertrophic toenails, 1 through 5 bilateral and clearing of subungual debris. No ulceration, no infection noted. Patient taking prednisone so decided to treat with brace.  Taught patient how to apply brace. Return Visit-Office Procedure: Patient instructed to return to the office for a follow up visit 3 months for continued evaluation and treatment.    Gardiner Barefoot DPM

## 2016-06-14 ENCOUNTER — Other Ambulatory Visit: Payer: Self-pay | Admitting: Family Medicine

## 2016-06-14 DIAGNOSIS — R1012 Left upper quadrant pain: Secondary | ICD-10-CM

## 2016-06-20 ENCOUNTER — Ambulatory Visit
Admission: RE | Admit: 2016-06-20 | Discharge: 2016-06-20 | Disposition: A | Discharge status: Home / Self Care | Payer: Medicare Other | Source: Ambulatory Visit | Attending: Family Medicine | Admitting: Family Medicine

## 2016-06-20 DIAGNOSIS — N281 Cyst of kidney, acquired: Secondary | ICD-10-CM | POA: Insufficient documentation

## 2016-06-20 DIAGNOSIS — Z9049 Acquired absence of other specified parts of digestive tract: Secondary | ICD-10-CM | POA: Insufficient documentation

## 2016-06-20 DIAGNOSIS — D1803 Hemangioma of intra-abdominal structures: Secondary | ICD-10-CM | POA: Insufficient documentation

## 2016-06-20 DIAGNOSIS — R1012 Left upper quadrant pain: Secondary | ICD-10-CM | POA: Diagnosis present

## 2016-08-05 ENCOUNTER — Ambulatory Visit (INDEPENDENT_AMBULATORY_CARE_PROVIDER_SITE_OTHER): Payer: Medicare Other | Admitting: Podiatry

## 2016-08-05 ENCOUNTER — Encounter: Payer: Self-pay | Admitting: Podiatry

## 2016-08-05 DIAGNOSIS — M79676 Pain in unspecified toe(s): Secondary | ICD-10-CM | POA: Diagnosis not present

## 2016-08-05 DIAGNOSIS — B351 Tinea unguium: Secondary | ICD-10-CM

## 2016-08-05 NOTE — Addendum Note (Signed)
Addended byDeidre Ala, Deandria Klute L on: 08/05/2016 02:00 PM   Modules accepted: Orders

## 2016-08-05 NOTE — Progress Notes (Signed)
Complaint:  Visit Type: Patient returns to my office for continued preventative foot care services. Complaint: Patient states" my nails have grown long and thick and become painful to walk and wear shoes" Patient states she is having moderate heel pain both feet.  Previously treated with braces and injection therapy .  Pain persists. The patient presents for preventative foot care services. No changes to ROS  Podiatric Exam: Vascular: dorsalis pedis and posterior tibial pulses are palpable bilateral. Capillary return is immediate. Temperature gradient is WNL. Skin turgor WNL  Sensorium: Normal Semmes Weinstein monofilament test. Normal tactile sensation bilaterally. Nail Exam: Pt has thick disfigured discolored nails with subungual debris noted bilateral entire nail hallux through fifth toenails Ulcer Exam: There is no evidence of ulcer or pre-ulcerative changes or infection. Orthopedic Exam: Muscle tone and strength are WNL. No limitations in general ROM. No crepitus or effusions noted. Foot type and digits show no abnormalities. Bony prominences are unremarkable.   Skin: No Porokeratosis. No infection or ulcers  Diagnosis:  Onychomycosis, , Pain in right toe, pain in left toes  Treatment & Plan Procedures and Treatment: Consent by patient was obtained for treatment procedures. The patient understood the discussion of treatment and procedures well. All questions were answered thoroughly reviewed. Debridement of mycotic and hypertrophic toenails, 1 through 5 bilateral and clearing of subungual debris. No ulceration, no infection noted. Patient taking prednisone so decided to treat with brace.  Return Visit-Office Procedure: Patient instructed to return to the office for a follow up visit 3 months for continued evaluation and treatment.    Gardiner Barefoot DPM

## 2016-09-30 ENCOUNTER — Ambulatory Visit: Payer: Medicare Other | Admitting: Podiatry

## 2016-11-18 ENCOUNTER — Ambulatory Visit (INDEPENDENT_AMBULATORY_CARE_PROVIDER_SITE_OTHER): Payer: Medicare Other | Admitting: Podiatry

## 2016-11-18 ENCOUNTER — Other Ambulatory Visit: Payer: Self-pay | Admitting: Family Medicine

## 2016-11-18 DIAGNOSIS — L309 Dermatitis, unspecified: Secondary | ICD-10-CM | POA: Insufficient documentation

## 2016-11-18 DIAGNOSIS — M79676 Pain in unspecified toe(s): Secondary | ICD-10-CM | POA: Diagnosis not present

## 2016-11-18 DIAGNOSIS — Z1231 Encounter for screening mammogram for malignant neoplasm of breast: Secondary | ICD-10-CM

## 2016-11-18 DIAGNOSIS — Q828 Other specified congenital malformations of skin: Secondary | ICD-10-CM

## 2016-11-18 DIAGNOSIS — B351 Tinea unguium: Secondary | ICD-10-CM

## 2016-11-18 DIAGNOSIS — M1990 Unspecified osteoarthritis, unspecified site: Secondary | ICD-10-CM | POA: Insufficient documentation

## 2016-11-18 DIAGNOSIS — M810 Age-related osteoporosis without current pathological fracture: Secondary | ICD-10-CM | POA: Insufficient documentation

## 2016-11-18 NOTE — Progress Notes (Addendum)
Complaint:  Visit Type: Patient returns to my office for continued preventative foot care services. Complaint: Patient states" my nails have grown long and thick and become painful to walk and wear shoes" Patient states she is having moderate heel pain both feet.  Previously treated with braces and injection therapy .  Pain persists. The patient presents for preventative foot care services. No changes to ROS  Podiatric Exam: Vascular: dorsalis pedis and posterior tibial pulses are palpable bilateral. Capillary return is immediate. Temperature gradient is WNL. Skin turgor WNL  Sensorium: Normal Semmes Weinstein monofilament test. Normal tactile sensation bilaterally. Nail Exam: Pt has thick disfigured discolored nails with subungual debris noted bilateral entire nail hallux through fifth toenails Ulcer Exam: There is no evidence of ulcer or pre-ulcerative changes or infection. Orthopedic Exam: Muscle tone and strength are WNL. No limitations in general ROM. No crepitus or effusions noted. Foot type and digits show no abnormalities. Bony prominences are unremarkable.   Skin: No Porokeratosis. No infection or ulcers  Diagnosis:  Onychomycosis, , Pain in right toe, pain in left toes  Treatment & Plan Procedures and Treatment: Consent by patient was obtained for treatment procedures. The patient understood the discussion of treatment and procedures well. All questions were answered thoroughly reviewed. Debridement of mycotic and hypertrophic toenails, 1 through 5 bilateral and clearing of subungual debris. No ulceration, no infection noted. Debride porokeratosis with #15 Blade. Return Visit-Office Procedure: Patient instructed to return to the office for a follow up visit 3 months for continued evaluation and treatment.    Gardiner Barefoot DPM

## 2017-02-10 ENCOUNTER — Ambulatory Visit
Admission: RE | Admit: 2017-02-10 | Discharge: 2017-02-10 | Disposition: A | Discharge status: Home / Self Care | Payer: Medicare Other | Source: Ambulatory Visit | Attending: Family Medicine | Admitting: Family Medicine

## 2017-02-10 DIAGNOSIS — Z1231 Encounter for screening mammogram for malignant neoplasm of breast: Secondary | ICD-10-CM | POA: Diagnosis present

## 2017-02-17 ENCOUNTER — Ambulatory Visit: Payer: Medicare Other | Admitting: Podiatry

## 2017-03-24 ENCOUNTER — Ambulatory Visit: Payer: Medicare Other | Admitting: Podiatry

## 2017-03-24 ENCOUNTER — Encounter: Payer: Self-pay | Admitting: Podiatry

## 2017-03-24 DIAGNOSIS — M79676 Pain in unspecified toe(s): Secondary | ICD-10-CM

## 2017-03-24 DIAGNOSIS — B351 Tinea unguium: Secondary | ICD-10-CM | POA: Diagnosis not present

## 2017-03-24 DIAGNOSIS — Q828 Other specified congenital malformations of skin: Secondary | ICD-10-CM

## 2017-03-24 NOTE — Progress Notes (Signed)
Complaint:  Visit Type: Patient returns to my office for continued preventative foot care services. Complaint: Patient states" my nails have grown long and thick and become painful to walk and wear shoes" Patient states she is having moderate heel pain both feet.  Previously treated with braces and injection therapy .  Pain persists. The patient presents for preventative foot care services. No changes to ROS  Podiatric Exam: Vascular: dorsalis pedis and posterior tibial pulses are palpable bilateral. Capillary return is immediate. Temperature gradient is WNL. Skin turgor WNL  Sensorium: Normal Semmes Weinstein monofilament test. Normal tactile sensation bilaterally. Nail Exam: Pt has thick disfigured discolored nails with subungual debris noted bilateral entire nail hallux through fifth toenails Ulcer Exam: There is no evidence of ulcer or pre-ulcerative changes or infection. Orthopedic Exam: Muscle tone and strength are WNL. No limitations in general ROM. No crepitus or effusions noted. Foot type and digits show no abnormalities. Bony prominences are unremarkable.   Skin: No Porokeratosis. No infection or ulcers  Diagnosis:  Onychomycosis, , Pain in right toe, pain in left toes  Treatment & Plan Procedures and Treatment: Consent by patient was obtained for treatment procedures. The patient understood the discussion of treatment and procedures well. All questions were answered thoroughly reviewed. Debridement of mycotic and hypertrophic toenails, 1 through 5 bilateral and clearing of subungual debris. No ulceration, no infection noted. Debride porokeratosis with #15 Blade. Return Visit-Office Procedure: Patient instructed to return to the office for a follow up visit 3 months for continued evaluation and treatment.    Gardiner Barefoot DPM

## 2017-06-26 ENCOUNTER — Ambulatory Visit: Payer: Medicare Other | Admitting: Podiatry

## 2017-07-14 ENCOUNTER — Encounter: Payer: Self-pay | Admitting: Podiatry

## 2017-07-14 ENCOUNTER — Ambulatory Visit: Payer: Medicare Other | Admitting: Podiatry

## 2017-07-14 DIAGNOSIS — M79676 Pain in unspecified toe(s): Secondary | ICD-10-CM

## 2017-07-14 DIAGNOSIS — B351 Tinea unguium: Secondary | ICD-10-CM | POA: Diagnosis not present

## 2017-07-14 DIAGNOSIS — M722 Plantar fascial fibromatosis: Secondary | ICD-10-CM

## 2017-07-14 DIAGNOSIS — Q828 Other specified congenital malformations of skin: Secondary | ICD-10-CM | POA: Diagnosis not present

## 2017-07-14 NOTE — Progress Notes (Signed)
Complaint:  Visit Type: Patient returns to my office for continued preventative foot care services. Complaint: Patient states" my nails have grown long and thick and become painful to walk and wear shoes" Patient states she is having moderate heel pain both feet.  Previously treated with braces and injection therapy .  Pain persists. The patient presents for preventative foot care services. No changes to ROS  Podiatric Exam: Vascular: dorsalis pedis and posterior tibial pulses are palpable bilateral. Capillary return is immediate. Temperature gradient is WNL. Skin turgor WNL  Sensorium: Normal Semmes Weinstein monofilament test. Normal tactile sensation bilaterally. Nail Exam: Pt has thick disfigured discolored nails with subungual debris noted bilateral entire nail hallux through fifth toenails Ulcer Exam: There is no evidence of ulcer or pre-ulcerative changes or infection. Orthopedic Exam: Muscle tone and strength are WNL. No limitations in general ROM. No crepitus or effusions noted. Foot type and digits show no abnormalities. Bony prominences are unremarkable.   Skin: No Porokeratosis. No infection or ulcers  Diagnosis:  Onychomycosis, , Pain in right toe, pain in left toes  Debride porokeratosis right foot.  Treatment & Plan Procedures and Treatment: Consent by patient was obtained for treatment procedures. The patient understood the discussion of treatment and procedures well. All questions were answered thoroughly reviewed. Debridement of mycotic and hypertrophic toenails, 1 through 5 bilateral and clearing of subungual debris. No ulceration, no infection noted. Debride porokeratosis with #15 Blade. Padding for arches was dispensed. Return Visit-Office Procedure: Patient instructed to return to the office for a follow up visit 3 months for continued evaluation and treatment.    Gardiner Barefoot DPM

## 2017-10-07 ENCOUNTER — Emergency Department
Admission: EM | Admit: 2017-10-07 | Discharge: 2017-10-07 | Disposition: A | Discharge status: Home / Self Care | Payer: Medicare Other | Attending: Emergency Medicine | Admitting: Emergency Medicine

## 2017-10-07 ENCOUNTER — Encounter: Payer: Self-pay | Admitting: Emergency Medicine

## 2017-10-07 DIAGNOSIS — I441 Atrioventricular block, second degree: Secondary | ICD-10-CM | POA: Diagnosis not present

## 2017-10-07 DIAGNOSIS — N3090 Cystitis, unspecified without hematuria: Secondary | ICD-10-CM | POA: Diagnosis not present

## 2017-10-07 DIAGNOSIS — Z79899 Other long term (current) drug therapy: Secondary | ICD-10-CM | POA: Diagnosis not present

## 2017-10-07 DIAGNOSIS — R001 Bradycardia, unspecified: Secondary | ICD-10-CM | POA: Diagnosis present

## 2017-10-07 LAB — COMPREHENSIVE METABOLIC PANEL
ALBUMIN: 4.3 g/dL (ref 3.5–5.0)
ALT: 30 U/L (ref 0–44)
ANION GAP: 9 (ref 5–15)
AST: 41 U/L (ref 15–41)
Alkaline Phosphatase: 50 U/L (ref 38–126)
BUN: 18 mg/dL (ref 8–23)
CO2: 29 mmol/L (ref 22–32)
Calcium: 9.1 mg/dL (ref 8.9–10.3)
Chloride: 104 mmol/L (ref 98–111)
Creatinine, Ser: 0.94 mg/dL (ref 0.44–1.00)
GFR calc Af Amer: >60 mL/min (ref >60)
GFR calc non Af Amer: 55 mL/min — ABNORMAL LOW (ref >60)
GLUCOSE: 106 mg/dL — AB (ref 70–99)
Potassium: 4.4 mmol/L (ref 3.5–5.1)
SODIUM: 142 mmol/L (ref 135–145)
Total Bilirubin: 1 mg/dL (ref 0.3–1.2)
Total Protein: 7 g/dL (ref 6.5–8.1)

## 2017-10-07 LAB — URINALYSIS, COMPLETE (UACMP) WITH MICROSCOPIC
BACTERIA UA: NONE SEEN
BILIRUBIN URINE: NEGATIVE
Glucose, UA: NEGATIVE mg/dL
KETONES UR: NEGATIVE mg/dL
NITRITE: NEGATIVE
PROTEIN: NEGATIVE mg/dL
Specific Gravity, Urine: 1.005 (ref 1.005–1.030)
pH: 7 (ref 5.0–8.0)

## 2017-10-07 LAB — CBC
HCT: 42.2 % (ref 35.0–47.0)
Hemoglobin: 14.3 g/dL (ref 12.0–16.0)
MCH: 32.2 pg (ref 26.0–34.0)
MCHC: 34 g/dL (ref 32.0–36.0)
MCV: 94.7 fL (ref 80.0–100.0)
PLATELETS: 112 10*3/uL — AB (ref 150–440)
RBC: 4.46 MIL/uL (ref 3.80–5.20)
RDW: 14.5 % (ref 11.5–14.5)
WBC: 8.5 10*3/uL (ref 3.6–11.0)

## 2017-10-07 LAB — TSH: TSH: 1.953 u[IU]/mL (ref 0.350–4.500)

## 2017-10-07 LAB — TROPONIN I: Troponin I: <0.03 ng/mL (ref <0.03)

## 2017-10-07 MED ORDER — NITROFURANTOIN MONOHYD MACRO 100 MG PO CAPS: 100.0000 mg | ORAL_CAPSULE | Freq: Two times a day (BID) | ORAL | 0 refills | Status: DC

## 2017-10-07 MED ORDER — SODIUM CHLORIDE 0.9 % IV SOLN
1.0000 g | Freq: Once | INTRAVENOUS | Status: AC
  Administered 2017-10-07: 1 g via INTRAVENOUS
  Filled 2017-10-07: qty 10

## 2017-10-07 NOTE — ED Provider Notes (Signed)
[facility] Emergency Department Provider Note       Time seen: ----------------------------------------- 11:14 AM on 10/07/2017 -----------------------------------------   I have reviewed the triage vital signs and the nursing notes.  HISTORY   Chief Complaint Bradycardia; Dysuria; and Leg Swelling    HPI Kelli [name] is a 83 y.o. female with a history of arthritis, edema, neuralgia, Raynaud's disease who presents to the ED for bradycardia.  She went to her doctor today because she was having dysuria after being treated for UTI she was told she had a low heart rate to come to the ER for evaluation.  She denies any symptoms, denies weakness, dizziness, lightheadedness or chest pain.  She does report swelling to both legs for the past several weeks.  Other than that she has no complaints.  Past Medical History:  Diagnosis Date  . Arthritis    Osteoporosis  . Cyst of kidney, acquired   . Diverticulosis   . Eczema   . Edema    feet/ankles  . Liver cyst   . Neuralgia    obsidia  . Neuralgia    OBSIDIA  . Pain    back/shoulder  . Pain    BACK,SHOULDER  . Platelets decreased ([place])   . Raynaud disease   . Raynaud's disease     Patient Active Problem List   Diagnosis Date Noted  . Osteoporosis 11/18/2016  . Eczema 11/18/2016  . Arthritis 11/18/2016  . PMR (polymyalgia rheumatica) (HCC) 10/02/2015  . Primary osteoarthritis of right knee 05/10/2015  . Rotator cuff tendinitis, left 02/27/2015  . Complete tear of right rotator cuff 02/27/2015    Past Surgical History:  Procedure Laterality Date  . ABDOMINAL HYSTERECTOMY    . CATARACT EXTRACTION W/PHACO Right 08/08/2014   Procedure: CATARACT EXTRACTION PHACO AND INTRAOCULAR LENS PLACEMENT (I[facility]);  Surgeon: Estill Cotta, MD;  Location: [facility] ORS;  Service: Ophthalmology;  Laterality: Right;  US:00:52 AP:24.8 CDE:22.37 Pack Lot #:[id number]  . CATARACT EXTRACTION W/PHACO Left 09/19/2014   Procedure: CATARACT EXTRACTION PHACO AND INTRAOCULAR LENS PLACEMENT ([place]);  Surgeon: Estill Cotta, MD;  Location: [facility] ORS;  Service: Ophthalmology;  Laterality: Left;  [place]: 01:05.6 AP%: 25.7 CDE: 29.05 Lot # [id number]  . CHOLECYSTECTOMY    . EYE SURGERY    . PARATHYROIDECTOMY     partial  . SHOULDER ARTHROSCOPY WITH OPEN ROTATOR CUFF REPAIR Right 08/08/2015   Procedure: SHOULDER ARTHROSCOPY WITH OPEN ROTATOR CUFF REPAIR;  Surgeon: Corky Mull, MD;  Location: [facility] ORS;  Service: Orthopedics;  Laterality: Right;    Allergies Hydrocodone; Amoxicillin; Amoxicillin-pot clavulanate; Ampicillin; Cefaclor; Ciprofloxacin; Clindamycin/lincomycin; Doxycycline; Erythromycin ethylsuccinate; Imipramine hcl; Ofloxacin; Propoxyphene; Sulfa antibiotics; Tape; Terfenadine; and Tobramycin  Social History Social History   Tobacco Use  . Smoking status: Never Smoker  . Smokeless tobacco: Never Used  Substance Use Topics  . Alcohol use: No  . Drug use: No   Review of Systems Constitutional: Negative for fever. Cardiovascular: Negative for chest pain. Respiratory: Negative for shortness of breath. Gastrointestinal: Negative for abdominal pain, vomiting and diarrhea. Genitourinary: Positive for dysuria Musculoskeletal: Positive for peripheral edema Skin: Negative for rash. Neurological: Negative for headaches, focal weakness or numbness.  All systems negative/normal/unremarkable except as stated in the HPI  ____________________________________________   PHYSICAL EXAM:  VITAL SIGNS: ED Triage Vitals  Enc Vitals Group     BP 10/07/17 1058 (!) 158/57     Pulse Rate 10/07/17 1058 (!) 36     Resp --  Temp 10/07/17 1058 98.6 F (37 C)     Temp Source 10/07/17 1058 Oral     SpO2 10/07/17 1058 100 %     Weight 10/07/17 1059 122 lb (55.3 kg)     Height 10/07/17 1059 4\' 9"  (1.448 m)     Head Circumference --      Peak Flow --      Pain Score 10/07/17 1059 0     Pain Loc --      Pain  Edu? --      Excl. in [place]? --    Constitutional: Alert and oriented. Well appearing and in no distress. Eyes: Conjunctivae are normal. Normal extraocular movements. ENT   Head: Normocephalic and atraumatic.   Nose: No congestion/rhinnorhea.   Mouth/Throat: Mucous membranes are moist.   Neck: No stridor. Cardiovascular: Slow rate, irregular rhythm. No murmurs, rubs, or gallops. Respiratory: Normal respiratory effort without tachypnea nor retractions. Breath sounds are clear and equal bilaterally. No wheezes/rales/rhonchi. Gastrointestinal: Soft and nontender. Normal bowel sounds Musculoskeletal: Nontender with normal range of motion in extremities. No lower extremity tenderness nor edema. Neurologic:  Normal speech and language. No gross focal neurologic deficits are appreciated.  Skin:  Skin is warm, dry and intact. No rash noted. Psychiatric: Mood and affect are normal. Speech and behavior are normal.  ____________________________________________  EKG: Interpreted by me.  Second-degree AV block type II, normal QRS size, normal QT, normal axis  ____________________________________________  ED COURSE:  As part of my medical decision making, I reviewed the following data within the [place] History obtained from family if available, nursing notes, old chart and ekg, as well as notes from prior ED visits. Patient presented for bradycardia, we will assess with labs and imaging as indicated at this time.  Patient is remarkably asymptomatic   Procedures ____________________________________________   LABS (pertinent positives/negatives)  Labs Reviewed  CBC - Abnormal; Notable for the following components:      Result Value   Platelets 112 (*)    All other components within normal limits  URINALYSIS, COMPLETE (UACMP) WITH MICROSCOPIC - Abnormal; Notable for the following components:   Color, Urine STRAW (*)    APPearance CLEAR (*)    Hgb urine dipstick  SMALL (*)    Leukocytes, UA LARGE (*)    All other components within normal limits  COMPREHENSIVE METABOLIC PANEL - Abnormal; Notable for the following components:   Glucose, Bld 106 (*)    GFR calc non Af Amer 55 (*)    All other components within normal limits  TROPONIN I  TSH   ____________________________________________  DIFFERENTIAL DIAGNOSIS   Arrhythmia, medication side effect, MI, electrolyte abnormality  FINAL ASSESSMENT AND PLAN  Second-degree heart block type II, urinary tract infection   Plan: The patient had presented for bradycardia. Patient's labs did not reveal any acute process with the exception of urinary tract infection.  And she remains asymptomatic for what appears to be a second degree heart block type II.  I will discuss with cardiology.   Laurence Aly, MD   Note: This note was generated in part or whole with voice recognition software. Voice recognition is usually quite accurate but there are transcription errors that can and very often do occur. I apologize for any typographical errors that were not detected and corrected.     Earleen Newport, MD 10/07/17 [phone]

## 2017-10-07 NOTE — ED Notes (Signed)
Dysuria

## 2017-10-07 NOTE — ED Triage Notes (Signed)
Pt reports went to the MD today because she was still having dysuria after being treated for a UTI and was told her heart rate was low and to come to the ED. Pt also reports swelling in her BLE for a couple of weeks. Pt states that she feels good.

## 2017-10-13 ENCOUNTER — Ambulatory Visit: Payer: Medicare Other | Admitting: Podiatry

## 2017-10-13 ENCOUNTER — Encounter: Payer: Self-pay | Admitting: Podiatry

## 2017-10-13 DIAGNOSIS — Q828 Other specified congenital malformations of skin: Secondary | ICD-10-CM | POA: Diagnosis not present

## 2017-10-13 DIAGNOSIS — B351 Tinea unguium: Secondary | ICD-10-CM

## 2017-10-13 DIAGNOSIS — M79676 Pain in unspecified toe(s): Secondary | ICD-10-CM

## 2017-10-13 NOTE — Progress Notes (Signed)
Complaint:  Visit Type: Patient returns to my office for continued preventative foot care services. Complaint: Patient states" my nails have grown long and thick and become painful to walk and wear shoes" . The patient presents for preventative foot care services. No changes to ROS  Podiatric Exam: Vascular: dorsalis pedis and posterior tibial pulses are palpable bilateral. Capillary return is immediate. Temperature gradient is WNL. Skin turgor WNL  Sensorium: Normal Semmes Weinstein monofilament test. Normal tactile sensation bilaterally. Nail Exam: Pt has thick disfigured discolored nails with subungual debris noted bilateral entire nail hallux through fifth toenails Ulcer Exam: There is no evidence of ulcer or pre-ulcerative changes or infection. Orthopedic Exam: Muscle tone and strength are WNL. No limitations in general ROM. No crepitus or effusions noted. Foot type and digits show no abnormalities. Bony prominences are unremarkable. Palpable pain at insertion plantar fascia  Bilateral.   Skin: No Porokeratosis. No infection or ulcers  Diagnosis:  Onychomycosis, , Pain in right toe, pain in left toes  Treatment & Plan Procedures and Treatment: Consent by patient was obtained for treatment procedures. The patient understood the discussion of treatment and procedures well. All questions were answered thoroughly reviewed. Debridement of mycotic and hypertrophic toenails, 1 through 5 bilateral and clearing of subungual debris. No ulceration, no infection noted.  Return Visit-Office Procedure: Patient instructed to return to the office for a follow up visit 3 months for continued evaluation and treatment.    Gardiner Barefoot DPM

## 2017-10-16 ENCOUNTER — Other Ambulatory Visit: Payer: Self-pay

## 2017-10-16 ENCOUNTER — Encounter
Admission: RE | Admit: 2017-10-16 | Discharge: 2017-10-16 | Disposition: A | Discharge status: Home / Self Care | Payer: Medicare Other | Source: Ambulatory Visit | Attending: Cardiology | Admitting: Cardiology

## 2017-10-16 ENCOUNTER — Ambulatory Visit
Admission: RE | Admit: 2017-10-16 | Discharge: 2017-10-16 | Disposition: A | Discharge status: Home / Self Care | Payer: Medicare Other | Source: Ambulatory Visit | Attending: Cardiology | Admitting: Cardiology

## 2017-10-16 DIAGNOSIS — M4854XA Collapsed vertebra, not elsewhere classified, thoracic region, initial encounter for fracture: Secondary | ICD-10-CM | POA: Diagnosis not present

## 2017-10-16 DIAGNOSIS — J9811 Atelectasis: Secondary | ICD-10-CM | POA: Diagnosis not present

## 2017-10-16 DIAGNOSIS — Z01818 Encounter for other preprocedural examination: Secondary | ICD-10-CM

## 2017-10-16 DIAGNOSIS — J90 Pleural effusion, not elsewhere classified: Secondary | ICD-10-CM | POA: Diagnosis not present

## 2017-10-16 DIAGNOSIS — I517 Cardiomegaly: Secondary | ICD-10-CM | POA: Diagnosis not present

## 2017-10-16 HISTORY — DX: Systemic involvement of connective tissue, unspecified: M35.9

## 2017-10-16 HISTORY — DX: Dysuria: R30.0

## 2017-10-16 HISTORY — DX: Localized edema: R60.0

## 2017-10-16 HISTORY — DX: Bradycardia, unspecified: R00.1

## 2017-10-16 LAB — CBC
HCT: 37.9 % (ref 35.0–47.0)
Hemoglobin: 12.7 g/dL (ref 12.0–16.0)
MCH: 31.4 pg (ref 26.0–34.0)
MCHC: 33.4 g/dL (ref 32.0–36.0)
MCV: 94 fL (ref 80.0–100.0)
PLATELETS: 115 10*3/uL — AB (ref 150–440)
RBC: 4.03 MIL/uL (ref 3.80–5.20)
RDW: 14.2 % (ref 11.5–14.5)
WBC: 9.1 10*3/uL (ref 3.6–11.0)

## 2017-10-16 LAB — PROTIME-INR
INR: 0.95
PROTHROMBIN TIME: 12.6 s (ref 11.4–15.2)

## 2017-10-16 LAB — BASIC METABOLIC PANEL
Anion gap: 7 (ref 5–15)
BUN: 22 mg/dL (ref 8–23)
CALCIUM: 8.9 mg/dL (ref 8.9–10.3)
CO2: 27 mmol/L (ref 22–32)
CREATININE: 1.05 mg/dL — AB (ref 0.44–1.00)
Chloride: 105 mmol/L (ref 98–111)
GFR calc Af Amer: 56 mL/min — ABNORMAL LOW (ref >60)
GFR calc non Af Amer: 48 mL/min — ABNORMAL LOW (ref >60)
Glucose, Bld: 100 mg/dL — ABNORMAL HIGH (ref 70–99)
Potassium: 4.1 mmol/L (ref 3.5–5.1)
SODIUM: 139 mmol/L (ref 135–145)

## 2017-10-16 LAB — SURGICAL PCR SCREEN
MRSA, PCR: NEGATIVE
Staphylococcus aureus: POSITIVE — AB

## 2017-10-16 LAB — APTT: aPTT: 30 seconds (ref 24–36)

## 2017-10-16 NOTE — Patient Instructions (Signed)
Your procedure is scheduled on: 10/23/17 Thur Report to Same Day Surgery 2nd floor medical mall [facility] Entrance-take elevator on left to 2nd floor.  Check in with surgery information desk.) To find out your arrival time please call [phone] between 1PM - 3PM on 10/22/17 Wed  Remember: Instructions that are not followed completely may result in serious medical risk, up to and including death, or upon the discretion of your surgeon and anesthesiologist your surgery may need to be rescheduled.    _x___ 1. Do not eat food after midnight the night before your procedure. You may drink clear liquids up to 2 hours before you are scheduled to arrive at the hospital for your procedure.  Do not drink clear liquids within 2 hours of your scheduled arrival to the hospital.  Clear liquids include  --Water or Apple juice without pulp  --Clear carbohydrate beverage such as ClearFast or Gatorade  --Black Coffee or Clear Tea (No milk, no creamers, do not add anything to                  the coffee or Tea Type 1 and type 2 diabetics should only drink water.   ____Ensure clear carbohydrate drink on the way to the hospital for bariatric patients  ____Ensure clear carbohydrate drink 3 hours before surgery for Dr Dwyane Luo patients if physician instructed.   No gum chewing or hard candies.     __x__ 2. No Alcohol for 24 hours before or after surgery.   __x__3. No Smoking or e-cigarettes for 24 prior to surgery.  Do not use any chewable tobacco products for at least 6 hour prior to surgery   ____  4. Bring all medications with you on the day of surgery if instructed.    __x__ 5. Notify your doctor if there is any change in your medical condition     (cold, fever, infections).    x___6. On the morning of surgery brush your teeth with toothpaste and water.  You may rinse your mouth with mouth wash if you wish.  Do not swallow any toothpaste or mouthwash.   Do not wear jewelry, make-up, hairpins,  clips or nail polish.  Do not wear lotions, powders, or perfumes. You may wear deodorant.  Do not shave 48 hours prior to surgery. Men may shave face and neck.  Do not bring valuables to the hospital.    [facility] is not responsible for any belongings or valuables.               Contacts, dentures or bridgework may not be worn into surgery.  Leave your suitcase in the car. After surgery it may be brought to your room.  For patients admitted to the hospital, discharge time is determined by your                       treatment team.  _  Patients discharged the day of surgery will not be allowed to drive home.  You will need someone to drive you home and stay with you the night of your procedure.    Please read over the following fact sheets that you were given:   [facility] Preparing for Surgery and or MRSA Information   _x___ Take anti-hypertensive listed below, cardiac, seizure, asthma,     anti-reflux and psychiatric medicines. These include:  1. gabapentin (NEURONTIN) 300 MG capsule  2.predniSONE (DELTASONE) 1 MG tablet   3.  4.  5.  6.  ____Fleets enema or Magnesium Citrate as directed.   _x___ Use CHG Soap or sage wipes as directed on instruction sheet   ____ Use inhalers on the day of surgery and bring to hospital day of surgery  ____ Stop Metformin and Janumet 2 days prior to surgery.    ____ Take 1/2 of usual insulin dose the night before surgery and none on the morning     surgery.   _x___ Follow recommendations from Cardiologist, Pulmonologist or PCP regarding          stopping Aspirin, Coumadin, Plavix ,Eliquis, Effient, or Pradaxa, and Pletal.  X____Stop Anti-inflammatories such as Advil, Aleve, Ibuprofen, Motrin, Naproxen, Naprosyn, Goodies powders or aspirin products. OK to take Tylenol and                          Celebrex.   _x___ Stop supplements until after surgery.  But may continue Vitamin D, Vitamin B,       and multivitamin.   ____ Bring C-Pap to the  hospital.

## 2017-10-21 ENCOUNTER — Ambulatory Visit (INDEPENDENT_AMBULATORY_CARE_PROVIDER_SITE_OTHER): Payer: Medicare Other | Admitting: Obstetrics and Gynecology

## 2017-10-21 ENCOUNTER — Telehealth: Payer: Self-pay | Admitting: Obstetrics and Gynecology

## 2017-10-21 ENCOUNTER — Encounter: Payer: Self-pay | Admitting: Obstetrics and Gynecology

## 2017-10-21 VITALS — BP 150/73 | HR 76 | Ht 59.0 in | Wt 119.8 lb

## 2017-10-21 DIAGNOSIS — R300 Dysuria: Secondary | ICD-10-CM | POA: Diagnosis not present

## 2017-10-21 DIAGNOSIS — Z780 Asymptomatic menopausal state: Secondary | ICD-10-CM

## 2017-10-21 DIAGNOSIS — N9489 Other specified conditions associated with female genital organs and menstrual cycle: Secondary | ICD-10-CM

## 2017-10-21 DIAGNOSIS — R350 Frequency of micturition: Secondary | ICD-10-CM

## 2017-10-21 DIAGNOSIS — Z90710 Acquired absence of both cervix and uterus: Secondary | ICD-10-CM

## 2017-10-21 DIAGNOSIS — L292 Pruritus vulvae: Secondary | ICD-10-CM

## 2017-10-21 DIAGNOSIS — N909 Noninflammatory disorder of vulva and perineum, unspecified: Secondary | ICD-10-CM

## 2017-10-21 LAB — POCT URINALYSIS DIPSTICK
Bilirubin, UA: NEGATIVE
Glucose, UA: NEGATIVE
Ketones, UA: NEGATIVE
NITRITE UA: NEGATIVE
PROTEIN UA: NEGATIVE
Urobilinogen, UA: 0.2 E.U./dL
pH, UA: 7 (ref 5.0–8.0)

## 2017-10-21 NOTE — Patient Instructions (Addendum)
1.  Urinalysis and urine culture are obtained today. 2.  Symptoms are likely related to a condition called lichen sclerosis.  This is a condition often associated with vulvar itching, vulvar burning; it can be treated with steroid usage topically; diagnosis needs to be made through biopsy. 3.  Return in 6 weeks for vulvar biopsy to confirm diagnosis; if appointment is desired sooner, please contact office.   Lichen Sclerosus Lichen sclerosus is a skin problem. It can happen on any part of the body, but it commonly involves the anal or genital areas. It can cause itching and discomfort in these areas. Treatment can help to control symptoms. When the genital area is affected, getting treatment is important because the condition can cause scarring that may lead to other problems. What are the causes? The cause of this condition is not known. It could be the result of an overactive immune system or a lack of certain hormones. Lichen sclerosus is not an infection or a fungus. It is not passed from one person to another (not contagious). What increases the risk? This condition is more likely to develop in women, usually after menopause. What are the signs or symptoms? Symptoms of this condition include:  Thin, wrinkled, white areas on the skin.  Thickened white areas on the skin.  Red and swollen patches (lesions) on the skin.  Tears or cracks in the skin.  Bruising.  Blood blisters.  Severe itching.  You may also have pain, itching, or burning with urination. Constipation is also common in people with lichen sclerosus. How is this diagnosed? This condition may be diagnosed with a physical exam. In some cases, a tissue sample (biopsy sample) may be removed to be looked at under a microscope. How is this treated? This condition is usually treated with medicated creams or ointments (topical steroids) that are applied over the affected areas. Follow these instructions at home:  Take  over-the-counter and prescription medicines only as told by your health care provider.  Use creams or ointments as told by your health care provider.  Do not scratch the affected areas of skin.  Women should keep the vaginal area as clean and dry as possible.  Keep all follow-up visits as told by your health care provider. This is important. Contact a health care provider if:  You have increasing redness, swelling, or pain in the affected area.  You have fluid, blood, or pus coming from the affected area.  You have new lesions on your skin.  You have a fever.  You have pain during sex. This information is not intended to replace advice given to you by your health care provider. Make sure you discuss any questions you have with your health care provider. Document Released: 05/30/2010 Document Revised: 06/15/2015 Document Reviewed: 04/04/2014 Elsevier Interactive Patient Education  [employer].

## 2017-10-21 NOTE — Progress Notes (Signed)
GYN ENCOUNTER NOTE  Subjective:       Kelli [name] is a 83 y.o. G41P2002 female is here for gynecologic evaluation of the following issues:  1.  Burning with urination 2.  Vulvar itching  84 year old female para 2-0-0-2, menopausal, on no ERT therapy, status post TAH, not currently sexually active, with history of comorbidities including polymyalgia rheumatica, Raynaud's disease, osteoporosis, arthritis, presents with complaints of burning with urination and vulvar irritation worsening over the past several weeks.  She was seen in urgent care and was treated for possible UTI.  She did not report significant urinary frequency, discomfort with actual voiding, flank pain, fever or chills.  She has not had history of recurrent UTIs.  Most recently patient was seen in the emergency room for bradycardia; she is scheduled for pacemaker in 2 days.  Patient states that she has a chronic vulvar condition with "'white skin" noted at the introitus.   Gynecologic History No LMP recorded. Patient has had a TAH Contraception: status post hysterectomy  Obstetric History OB History  Gravida Para Term Preterm AB Living  2 2 2     2   SAB TAB Ectopic Multiple Live Births          2    # Outcome Date GA Lbr Len/2nd Weight Sex Delivery Anes PTL Lv  2 Term 1966    M Vag-Spont   LIV  1 Term 1962    F Vag-Spont   LIV    Past Medical History:  Diagnosis Date  . Arthritis    Osteoporosis  . Autoimmune disease ([place])   . Bradycardia   . Burning with urination   . Cyst of kidney, acquired   . Diverticulosis   . Eczema   . Edema    feet/ankles  . Liver cyst   . Lower extremity edema   . Neuralgia    obsidia  . Neuralgia    OBSIDIA  . Pain    back/shoulder  . Pain    BACK,SHOULDER  . Platelets decreased ([place])   . Raynaud disease   . Raynaud's disease     Past Surgical History:  Procedure Laterality Date  . ABDOMINAL HYSTERECTOMY    . CATARACT EXTRACTION W/PHACO Right 08/08/2014    Procedure: CATARACT EXTRACTION PHACO AND INTRAOCULAR LENS PLACEMENT (IOC);  Surgeon: Estill Cotta, MD;  Location: [facility] ORS;  Service: Ophthalmology;  Laterality: Right;  US:00:52 AP:24.8 CDE:22.37 Pack Lot #:[id number]  . CATARACT EXTRACTION W/PHACO Left 09/19/2014   Procedure: CATARACT EXTRACTION PHACO AND INTRAOCULAR LENS PLACEMENT ([place]);  Surgeon: Estill Cotta, MD;  Location: [facility] ORS;  Service: Ophthalmology;  Laterality: Left;  [place]: 01:05.6 AP%: 25.7 CDE: 29.05 Lot # [id number]  . CHOLECYSTECTOMY    . EYE SURGERY    . PARATHYROIDECTOMY     partial  . SHOULDER ARTHROSCOPY WITH OPEN ROTATOR CUFF REPAIR Right 08/08/2015   Procedure: SHOULDER ARTHROSCOPY WITH OPEN ROTATOR CUFF REPAIR;  Surgeon: Corky Mull, MD;  Location: [facility] ORS;  Service: Orthopedics;  Laterality: Right;    Current Outpatient Medications on File Prior to Visit  Medication Sig Dispense Refill  . gabapentin (NEURONTIN) 300 MG capsule Take 300 mg by mouth 2 (two) times daily.     . predniSONE (DELTASONE) 1 MG tablet Take 4 mg by mouth daily.   3   No current facility-administered medications on file prior to visit.     Allergies  Allergen Reactions  . Hydrocodone Nausea And Vomiting  . Amoxicillin Rash and Other (See  Comments)    Has patient had a PCN reaction causing immediate rash, facial/tongue/throat swelling, SOB or lightheadedness with hypotension: Yes Has patient had a PCN reaction causing severe rash involving mucus membranes or skin necrosis: No Has patient had a PCN reaction that required hospitalization: No Has patient had a PCN reaction occurring within the last 10 years: No If all of the above answers are "NO", then may proceed with Cephalosporin use.  Marland Kitchen Amoxicillin-Pot Clavulanate Rash  . Ampicillin Rash  . Cefaclor Rash  . Ciprofloxacin Rash  . Clindamycin/Lincomycin Diarrhea and Rash  . Doxycycline Rash  . Erythromycin Ethylsuccinate Rash  . Imipramine Hcl Rash  . Ofloxacin Rash  .  Propoxyphene Rash  . Sulfa Antibiotics Rash  . Tape Rash and Other (See Comments)    adhesive  . Terfenadine Rash  . Tobramycin Rash    Social History   Socioeconomic History  . Marital status: Unknown    Spouse name: Not on file  . Number of children: Not on file  . Years of education: Not on file  . Highest education level: Not on file  Occupational History  . Not on file  Social Needs  . Financial resource strain: Not on file  . Food insecurity:    Worry: Not on file    Inability: Not on file  . Transportation needs:    Medical: Not on file    Non-medical: Not on file  Tobacco Use  . Smoking status: Never Smoker  . Smokeless tobacco: Never Used  Substance and Sexual Activity  . Alcohol use: No  . Drug use: No  . Sexual activity: Not Currently  Lifestyle  . Physical activity:    Days per week: Not on file    Minutes per session: Not on file  . Stress: Not on file  Relationships  . Social connections:    Talks on phone: Not on file    Gets together: Not on file    Attends religious service: Not on file    Active member of club or organization: Not on file    Attends meetings of clubs or organizations: Not on file    Relationship status: Not on file  . Intimate partner violence:    Fear of current or ex partner: Not on file    Emotionally abused: Not on file    Physically abused: Not on file    Forced sexual activity: Not on file  Other Topics Concern  . Not on file  Social History Narrative  . Not on file    Family History  Problem Relation Age of Onset  . Breast cancer Sister 67  . Ovarian cancer Neg Hx   . Colon cancer Neg Hx     The following portions of the patient's history were reviewed and updated as appropriate: allergies, current medications, past family history, past medical history, past social history, past surgical history and problem list.  Review of Systems Review of Systems -complete review of systems is positive as noted in the  HPI.  Objective:   BP (!) 150/73   Pulse 76   Ht 4\' 11"  (1.499 m)   Wt 119 lb 12.8 oz (54.3 kg)   BMI 24.20 kg/m  CONSTITUTIONAL: Well-developed, well-nourished female in no acute distress.  HENT:  Normocephalic, atraumatic.  NECK: Normal range of motion, supple, no masses.  Normal thyroid.  SKIN: Skin is warm and dry. No rash noted. Not diaphoretic. No erythema. No pallor.  Changes of perineum-see below [place]:  Alert and oriented to person, place, and time. PSYCHIATRIC: Normal mood and affect. Normal behavior. Normal judgment and thought content. CARDIOVASCULAR:Not Examined RESPIRATORY: Not Examined BREASTS: Not Examined ABDOMEN: Soft, non distended; Non tender.  No Organomegaly. PELVIC:  External Genitalia: Atrophic labia majora and minora with agglutination; parchmentlike white leukoplakia noted along the left aspect of the introitus; there is symmetric hypopigmentation of the perianal region.  There is no epithelial skin breakdown.  BUS: Urethral caruncle  Vagina: Moderate atrophic changes; no discharge  Cervix: Surgically absent  Uterus: Surgically absent  Adnexa: Nonpalpable and nontender; no masses  RV: Symmetric perianal hypopigmentation/leukoplakia?  Bladder: Nontender MUSCULOSKELETAL: Normal range of motion. No tenderness.  No cyanosis, clubbing.  Mild ankle edema.     Assessment:   1. Dysuria; discomfort is post void irritation; doubt UTI - Urine Culture - POCT urinalysis dipstick  2. Frequency of urination  3. Status post abdominal hysterectomy  4. Menopause  5. Vulvar itching  6. Vulvar burning  7.  Suspect lichen sclerosus     Plan:   1.  Urinalysis and urine culture is obtained. 2.  Patient is to return within the next 4 to 6 weeks for vulvar biopsy to confirm diagnosis. 3.  Patient understands that the symptoms and physical exam are suspicious for a condition known as lichen sclerosus; doubt there is a vulvar precancer or cancer present.   Literature was given regarding lichen sclerosus; management of this condition also was reviewed. 4.  Patient is to undergo pacemaker implant in 2 days  Brayton Mars, MD  Note: This dictation was prepared with Dragon dictation along with smaller phrase technology. Any transcriptional errors that result from this process are unintentional.

## 2017-10-21 NOTE — Telephone Encounter (Signed)
Error

## 2017-10-22 DIAGNOSIS — N9489 Other specified conditions associated with female genital organs and menstrual cycle: Secondary | ICD-10-CM | POA: Insufficient documentation

## 2017-10-22 DIAGNOSIS — R350 Frequency of micturition: Secondary | ICD-10-CM | POA: Insufficient documentation

## 2017-10-22 DIAGNOSIS — Z90710 Acquired absence of both cervix and uterus: Secondary | ICD-10-CM | POA: Insufficient documentation

## 2017-10-22 DIAGNOSIS — N909 Noninflammatory disorder of vulva and perineum, unspecified: Secondary | ICD-10-CM | POA: Insufficient documentation

## 2017-10-22 MED ORDER — VANCOMYCIN HCL IN DEXTROSE 1-5 GM/200ML-% IV SOLN
1000.0000 mg | Freq: Once | INTRAVENOUS | Status: AC
  Administered 2017-10-23: 1000 mg via INTRAVENOUS

## 2017-10-23 ENCOUNTER — Encounter
Admission: RE | Disposition: A | Discharge status: Home / Self Care | Payer: Self-pay | Source: Ambulatory Visit | Attending: Cardiology

## 2017-10-23 ENCOUNTER — Observation Stay
Admission: RE | Admit: 2017-10-23 | Discharge: 2017-10-24 | Disposition: A | Discharge status: Home / Self Care | Payer: Medicare Other | Source: Ambulatory Visit | Attending: Cardiology | Admitting: Cardiology

## 2017-10-23 ENCOUNTER — Ambulatory Visit: Payer: Medicare Other | Admitting: Anesthesiology

## 2017-10-23 ENCOUNTER — Encounter: Payer: Self-pay | Admitting: Certified Registered Nurse Anesthetist

## 2017-10-23 ENCOUNTER — Ambulatory Visit: Payer: Medicare Other

## 2017-10-23 ENCOUNTER — Other Ambulatory Visit: Payer: Self-pay

## 2017-10-23 ENCOUNTER — Observation Stay: Payer: Medicare Other

## 2017-10-23 DIAGNOSIS — Z880 Allergy status to penicillin: Secondary | ICD-10-CM | POA: Insufficient documentation

## 2017-10-23 DIAGNOSIS — M810 Age-related osteoporosis without current pathological fracture: Secondary | ICD-10-CM | POA: Insufficient documentation

## 2017-10-23 DIAGNOSIS — Z882 Allergy status to sulfonamides status: Secondary | ICD-10-CM | POA: Insufficient documentation

## 2017-10-23 DIAGNOSIS — Z950 Presence of cardiac pacemaker: Secondary | ICD-10-CM

## 2017-10-23 DIAGNOSIS — M353 Polymyalgia rheumatica: Secondary | ICD-10-CM | POA: Diagnosis not present

## 2017-10-23 DIAGNOSIS — Z885 Allergy status to narcotic agent status: Secondary | ICD-10-CM | POA: Insufficient documentation

## 2017-10-23 DIAGNOSIS — M1990 Unspecified osteoarthritis, unspecified site: Secondary | ICD-10-CM | POA: Insufficient documentation

## 2017-10-23 DIAGNOSIS — R001 Bradycardia, unspecified: Secondary | ICD-10-CM | POA: Diagnosis not present

## 2017-10-23 DIAGNOSIS — I441 Atrioventricular block, second degree: Secondary | ICD-10-CM | POA: Diagnosis not present

## 2017-10-23 HISTORY — PX: PACEMAKER INSERTION: SHX728

## 2017-10-23 LAB — URINE CULTURE

## 2017-10-23 SURGERY — INSERTION, CARDIAC PACEMAKER
Anesthesia: General

## 2017-10-23 MED ORDER — ONDANSETRON HCL 4 MG/2ML IJ SOLN
INTRAMUSCULAR | Status: DC | PRN
  Administered 2017-10-23: 4 mg via INTRAVENOUS

## 2017-10-23 MED ORDER — FENTANYL CITRATE (PF) 100 MCG/2ML IJ SOLN
INTRAMUSCULAR | Status: DC | PRN
  Administered 2017-10-23 (×2): 25 ug via INTRAVENOUS

## 2017-10-23 MED ORDER — FENTANYL CITRATE (PF) 100 MCG/2ML IJ SOLN
INTRAMUSCULAR | Status: AC
  Filled 2017-10-23: qty 2

## 2017-10-23 MED ORDER — SODIUM CHLORIDE 0.9 % IV SOLN
Freq: Once | INTRAVENOUS | Status: DC
  Filled 2017-10-23: qty 2

## 2017-10-23 MED ORDER — PROPOFOL 10 MG/ML IV BOLUS
INTRAVENOUS | Status: AC
  Filled 2017-10-23: qty 20

## 2017-10-23 MED ORDER — LACTATED RINGERS IV SOLN
INTRAVENOUS | Status: DC
  Administered 2017-10-23: 12:00:00 via INTRAVENOUS

## 2017-10-23 MED ORDER — LIDOCAINE HCL (CARDIAC) PF 100 MG/5ML IV SOSY
PREFILLED_SYRINGE | INTRAVENOUS | Status: DC | PRN
  Administered 2017-10-23: 50 mg via INTRAVENOUS

## 2017-10-23 MED ORDER — SODIUM CHLORIDE 0.9 % IV SOLN
INTRAVENOUS | Status: DC | PRN
  Administered 2017-10-23: 100 mL

## 2017-10-23 MED ORDER — LIDOCAINE HCL (PF) 2 % IJ SOLN
INTRAMUSCULAR | Status: AC
  Filled 2017-10-23: qty 10

## 2017-10-23 MED ORDER — VANCOMYCIN HCL IN DEXTROSE 1-5 GM/200ML-% IV SOLN
INTRAVENOUS | Status: AC
  Filled 2017-10-23: qty 200

## 2017-10-23 MED ORDER — LACTATED RINGERS IV SOLN
INTRAVENOUS | Status: DC | PRN
  Administered 2017-10-23: 12:00:00 via INTRAVENOUS

## 2017-10-23 MED ORDER — FUROSEMIDE 20 MG PO TABS
20.0000 mg | ORAL_TABLET | Freq: Every day | ORAL | Status: DC
  Administered 2017-10-23 – 2017-10-24 (×2): 20 mg via ORAL
  Filled 2017-10-23 (×2): qty 1

## 2017-10-23 MED ORDER — FAMOTIDINE 20 MG PO TABS
20.0000 mg | ORAL_TABLET | Freq: Once | ORAL | Status: AC
  Administered 2017-10-23: 20 mg via ORAL

## 2017-10-23 MED ORDER — PROPOFOL 500 MG/50ML IV EMUL
INTRAVENOUS | Status: DC | PRN
  Administered 2017-10-23: 75 ug/kg/min via INTRAVENOUS

## 2017-10-23 MED ORDER — ONDANSETRON HCL 4 MG/2ML IJ SOLN: 4.0000 mg | Freq: Four times a day (QID) | INTRAMUSCULAR | Status: DC | PRN

## 2017-10-23 MED ORDER — ACETAMINOPHEN 325 MG PO TABS: 325.0000 mg | ORAL_TABLET | ORAL | Status: DC | PRN

## 2017-10-23 MED ORDER — GABAPENTIN 300 MG PO CAPS
300.0000 mg | ORAL_CAPSULE | Freq: Two times a day (BID) | ORAL | Status: DC
  Administered 2017-10-23 – 2017-10-24 (×2): 300 mg via ORAL
  Filled 2017-10-23 (×3): qty 1

## 2017-10-23 MED ORDER — VANCOMYCIN HCL IN DEXTROSE 1-5 GM/200ML-% IV SOLN
1000.0000 mg | Freq: Two times a day (BID) | INTRAVENOUS | Status: AC
  Administered 2017-10-23: 1000 mg via INTRAVENOUS
  Filled 2017-10-23: qty 200

## 2017-10-23 MED ORDER — PROPOFOL 10 MG/ML IV BOLUS
INTRAVENOUS | Status: DC | PRN
  Administered 2017-10-23: 40 mg via INTRAVENOUS

## 2017-10-23 MED ORDER — POTASSIUM CHLORIDE CRYS ER 10 MEQ PO TBCR
10.0000 meq | EXTENDED_RELEASE_TABLET | Freq: Every day | ORAL | Status: DC
  Administered 2017-10-23 – 2017-10-24 (×2): 10 meq via ORAL
  Filled 2017-10-23 (×2): qty 1

## 2017-10-23 MED ORDER — FAMOTIDINE 20 MG PO TABS
ORAL_TABLET | ORAL | Status: AC
  Filled 2017-10-23: qty 1

## 2017-10-23 MED ORDER — LIDOCAINE 1 % OPTIME INJ - NO CHARGE
INTRAMUSCULAR | Status: DC | PRN
  Administered 2017-10-23: 29 mL

## 2017-10-23 MED ORDER — PHENYLEPHRINE HCL 10 MG/ML IJ SOLN
INTRAMUSCULAR | Status: DC | PRN
  Administered 2017-10-23 (×3): 100 ug via INTRAVENOUS

## 2017-10-23 SURGICAL SUPPLY — 38 items
BAG DECANTER FOR FLEXI CONT (MISCELLANEOUS) ×3 IMPLANT
BRUSH SCRUB EZ  4% CHG (MISCELLANEOUS) ×2
BRUSH SCRUB EZ 4% CHG (MISCELLANEOUS) ×1 IMPLANT
CABLE SURG 12 DISP A/V CHANNEL (MISCELLANEOUS) ×3 IMPLANT
CANISTER SUCT 1200ML W/VALVE (MISCELLANEOUS) ×3 IMPLANT
CHLORAPREP W/TINT 26ML (MISCELLANEOUS) ×3 IMPLANT
COVER LIGHT HANDLE STERIS (MISCELLANEOUS) ×6 IMPLANT
COVER MAYO STAND STRL (DRAPES) ×3 IMPLANT
DRAPE C-ARM XRAY 36X54 (DRAPES) ×3 IMPLANT
DRSG TEGADERM 4X4.75 (GAUZE/BANDAGES/DRESSINGS) ×3 IMPLANT
DRSG TELFA 4X3 1S NADH ST (GAUZE/BANDAGES/DRESSINGS) ×3 IMPLANT
ELECT REM PT RETURN 9FT ADLT (ELECTROSURGICAL) ×3
ELECTRODE REM PT RTRN 9FT ADLT (ELECTROSURGICAL) ×1 IMPLANT
GLOVE BIO SURGEON STRL SZ7.5 (GLOVE) ×3 IMPLANT
GLOVE BIO SURGEON STRL SZ8 (GLOVE) ×3 IMPLANT
GOWN STRL REUS W/ TWL LRG LVL3 (GOWN DISPOSABLE) ×1 IMPLANT
GOWN STRL REUS W/ TWL XL LVL3 (GOWN DISPOSABLE) ×1 IMPLANT
GOWN STRL REUS W/TWL LRG LVL3 (GOWN DISPOSABLE) ×2
GOWN STRL REUS W/TWL XL LVL3 (GOWN DISPOSABLE) ×2
IMMOBILIZER SHDR LG LX 900803 (SOFTGOODS) ×3 IMPLANT
IMMOBILIZER SHDR MD LX WHT (SOFTGOODS) IMPLANT
IMMOBILIZER SHDR XL LX WHT (SOFTGOODS) IMPLANT
INTRO PACEMAKR LEAD 9FR 13CM (INTRODUCER) ×3
INTRO PACEMKR SHEATH II 7FR (MISCELLANEOUS) ×3
INTRODUCER PACEMKR LD 9FR 13CM (INTRODUCER) ×1 IMPLANT
INTRODUCER PACEMKR SHTH II 7FR (MISCELLANEOUS) ×1 IMPLANT
IPG PACE AZUR XT DR MRI W1DR01 (Pacemaker) ×1 IMPLANT
IV NS 500ML (IV SOLUTION) ×2
IV NS 500ML BAXH (IV SOLUTION) ×1 IMPLANT
KIT TURNOVER KIT A (KITS) ×3 IMPLANT
LABEL OR SOLS (LABEL) ×3 IMPLANT
LEAD CAPSURE NOVUS 5076-52CM (Lead) ×3 IMPLANT
MARKER SKIN DUAL TIP RULER LAB (MISCELLANEOUS) ×3 IMPLANT
PACE AZURE XT DR MRI W1DR01 (Pacemaker) ×3 IMPLANT
PACEMAKER LEAD ATRL (Lead) ×3 IMPLANT
PACK PACE INSERTION (MISCELLANEOUS) ×3 IMPLANT
PAD ONESTEP ZOLL R SERIES ADT (MISCELLANEOUS) ×3 IMPLANT
SUT SILK 0 SH 30 (SUTURE) ×9 IMPLANT

## 2017-10-23 NOTE — Anesthesia Preprocedure Evaluation (Signed)
Anesthesia Evaluation  Patient identified by MRN, date of birth, ID band Patient awake    Reviewed: Allergy & Precautions, NPO status , Patient's Chart, lab work & pertinent test results  History of Anesthesia Complications Negative for: history of anesthetic complications  Airway Mallampati: II  TM Distance: >3 FB Neck ROM: Full    Dental no notable dental hx.    Pulmonary neg pulmonary ROS, neg sleep apnea, neg COPD,    breath sounds clear to auscultation- rhonchi (-) wheezing      Cardiovascular Exercise Tolerance: Good (-) hypertension(-) CAD, (-) Past MI, (-) Cardiac Stents and (-) CABG + dysrhythmias (bradycardia)  Rhythm:Regular Rate:Normal - Systolic murmurs and - Diastolic murmurs    Neuro/Psych negative neurological ROS  negative psych ROS   GI/Hepatic negative GI ROS, Neg liver ROS,   Endo/Other  negative endocrine ROS  Renal/GU Renal disease (renal cysts)     Musculoskeletal  (+) Arthritis ,   Abdominal (+) - obese,   Peds  Hematology negative hematology ROS (+)   Anesthesia Other Findings Past Medical History: No date: Arthritis     Comment:  Osteoporosis No date: Autoimmune disease (HCC) No date: Bradycardia No date: Burning with urination No date: Cyst of kidney, acquired No date: Diverticulosis No date: Eczema No date: Edema     Comment:  feet/ankles No date: Liver cyst No date: Lower extremity edema No date: Neuralgia     Comment:  obsidia No date: Neuralgia     Comment:  OBSIDIA No date: Pain     Comment:  back/shoulder No date: Pain     Comment:  BACK,SHOULDER No date: Platelets decreased (HCC) No date: Raynaud disease No date: Raynaud's disease   Reproductive/Obstetrics                             Anesthesia Physical Anesthesia Plan  ASA: II  Anesthesia Plan: General   Post-op Pain Management:    Induction: Intravenous  PONV Risk Score and  Plan: 2 and Propofol infusion  Airway Management Planned: Natural Airway  Additional Equipment:   Intra-op Plan:   Post-operative Plan:   Informed Consent: I have reviewed the patients History and Physical, chart, labs and discussed the procedure including the risks, benefits and alternatives for the proposed anesthesia with the patient or authorized representative who has indicated his/her understanding and acceptance.   Dental advisory given  Plan Discussed with: CRNA and Anesthesiologist  Anesthesia Plan Comments:         Anesthesia Quick Evaluation

## 2017-10-23 NOTE — Op Note (Signed)
[facility]   10/23/2017                     1:46 PM  PATIENT:  Kelli [name]    PRE-OPERATIVE DIAGNOSIS:  BRADYCARDIA  POST-OPERATIVE DIAGNOSIS:  Same  PROCEDURE:  INSERTION PACEMAKER-DUAL CHAMBER INITIAL  SURGEON:  Isaias Cowman, MD    ANESTHESIA:     PREOPERATIVE INDICATIONS:  [name] is a  83 y.o. female with a diagnosis of BRADYCARDIA who failed conservative measures and elected for surgical management.    The risks benefits and alternatives were discussed with the patient preoperatively including but not limited to the risks of infection, bleeding, cardiopulmonary complications, the need for revision surgery, among others, and the patient was willing to proceed.   OPERATIVE PROCEDURE: The patient was brought to the operating room fasting state.  The left pectoral region was prepped and draped in usual sterile manner.  Anesthesia was obtained 1% lidocaine locally.  A 6 cm incision was performed to the left pectoral region.  The pacemaker pocket was generated by electrocautery and blunt dissection.  Access was obtained to the left subclavian vein by fine-needle aspiration.  MRI compatible leads were positions right ventricular apical septum ( Medtronic [id number] ) and right atrial appendage 9 Medtronic [id number] ) under fluoroscopic guidance.  After proper thresholds were obtained the leads were sutured in place.  The leads were connected MRI compatible dual-chamber rate responsive pacemaker generator ( Medtronic [id number] ).  Pacemaker pocket was irrigated gentamicin solution.  The pacemaker generator was positioned into the pocket and the pocket was closed with 2-0 and 4-0 Vicryl, respectively.  Steri-Strips or pressure dressing were applied.  There were no periprocedural complications.  Postprocedural interrogation revealed appropriate atrial and ventricular sensing and pacing thresholds.

## 2017-10-23 NOTE — Plan of Care (Signed)

## 2017-10-23 NOTE — OR Nursing (Signed)
12:00 Attempted to start pump for vacomycin but Brandy here to take patient to surgery.  Clamped Vancomycin fr transport.  CRNA will unclamp in OR

## 2017-10-23 NOTE — Anesthesia Post-op Follow-up Note (Signed)
Anesthesia QCDR form completed.        

## 2017-10-23 NOTE — H&P (Signed)
Jump to Section ? Document InformationEncounter DetailsGoalsHistorical MedicationsLast Filed Vital SignsOrdered PrescriptionsPatient ContactsPatient DemographicsPatient InstructionsPlan of TreatmentProgress NotesReason for VisitSocial HistoryVisit Diagnoses Kelli [name], generated on Sep. 26, 2019September 26, 2019 Printout Information  Document Contents Document Received Date Document Source Organization  Initial consult Sep. 26, 2019September 26, 2019 [place]  Patient Demographics - 83 y.o. Female; born [birth year]   Patient Address Communication Language Race / Ethnicity Marital Status  [place], [place] [address] [phone] [facility]) [phone] (Home) English (Preferred) Dema Severin / Not Hispanic or Latino Widowed  Reason for Visit   Reason Comments  Establish Care NEW PT PER [facility]  Shortness of Breath   Edema   Encounter Details   Date Type Department Care Team Description  10/08/2017 Initial consult [facility]  [place], [place] [address]  [phone]  Kelli [name], Kelli [name]  [address]  [place], [place] [address]  [phone]  [phone] (Fax)  Mobitz type 2 second degree heart block (Primary Dx);  Bradycardia;  PMR (polymyalgia rheumatica) (CMS-HCC);  Shortness of breath;  Edema of both legs  Social History - documented as of this encounter  Tobacco Use Types Packs/Day Years Used Date  Never Smoker      Smokeless Tobacco: Never Used      Tobacco Cessation: Counseling Given: No   Alcohol Use Drinks/Week oz/Week Comments  No 0 Standard drinks or equivalent  0.0 Occasional.   Sex Assigned at [employer] Date Recorded  Not on file    Job Start Date Occupation Industry  Not on file Not on file Not on file   Travel History Travel Start Travel End  No recent travel history available.    Last Filed Vital  Signs - documented in this encounter  Vital Sign [name] Time Taken Comments  Blood Pressure 132/58 10/08/2017 10:31 AM EDT   Pulse 38 10/08/2017 10:31 AM EDT   Temperature - -   Respiratory Rate - -   Oxygen Saturation 98% 10/08/2017 10:31 AM EDT   Inhaled Oxygen Concentration - -   Weight 55.7 kg (122 lb 12.7 oz) 10/08/2017 10:31 AM EDT   Height 144.8 cm (4\' 9" ) 10/08/2017 10:31 AM EDT   Body Mass Index 26.57 10/08/2017 10:31 AM EDT   Patient Instructions - documented in this encounter  Patient Instructions Kelli [name], Kelli [name] - 10/08/2017 9:45 AM EDT  Take Lasix for 3 days then Stop.  Electronically signed by Kelli [name], Kelli [name] at 10/08/2017 11:36 AM EDT    Ordered Prescriptions - documented in this encounter  Prescription Sig Dispensed Refills Start Date End Date  potassium chloride (KLOR-CON) 10 MEQ ER tablet  Take 1 tablet (10 mEq total) by mouth once daily 30 tablet  2 10/08/2017 10/08/2018  FUROsemide (LASIX) 20 MG tablet  Take 1 tablet (20 mg total) by mouth once daily 30 tablet  2 10/08/2017 10/08/2018  Progress Notes - documented in this encounter  Kelli [name], Kelli [name] - 10/08/2017 9:45 AM EDT Formatting of this note might be different from the original. New Patient Visit   Chief Complaint: Chief Complaint  Patient presents with  . Establish Care  NEW PT PER [facility]  . Shortness of Breath  . Edema  Date of Service: 10/08/2017 Date of Birth: [birth year] PCP: Kelli [name], Kelli [name]  History of Present Illness: Kelli [name] is a 83 y.o.female patient who presents after being found to have  found bradycardia evaluation emergency room suggested heart rate less than 40 patient also appeared to be in second-degree heart block. Patient states to be relatively asymptomatic no blackout spells no syncope no chest pain. Patient had not been told her heart rate was slow until recently. She still fairly active. She complains of lower extremity edema  and has not sought medical attention for that yet as well now here for follow-up evaluation since being seen in emergency room yesterday on 9/17  Past Medical and Surgical History  Past Medical History Past Medical History:  Diagnosis Date  . Arthritis  . Cataract  . Chickenpox  . Eczema  . Hemorrhoids  . Osteoporosis  . PMR (polymyalgia rheumatica) (CMS-HCC)  . Psoriasis  . Seasonal allergies   Past Surgical History She has a past surgical history that includes Hysterectomy Vaginal; Parathyroidectomy (1989); Cholecystectomy (2002); and Limited arthroscopic debridement, arthroscopic subacromial decompression, mini-open rotator cuff repair, and mini-open biceps tenodesis, right shoulder. (Right, 08/08/2015).   Medications and Allergies  Current Medications Current Outpatient Medications  Medication Sig Dispense Refill  . nitrofurantoin, macrocrystal-monohydrate, (MACROBID) 100 MG capsule Take by mouth  . CALCIUM CITRATE/VITAMIN D3 (CITRACAL + D ORAL) Take 2 tablets by mouth once daily.  . cholecalciferol (VITAMIN D3) 1,000 unit tablet Take 1,000 Units by mouth once daily.  . FUROsemide (LASIX) 20 MG tablet Take 1 tablet (20 mg total) by mouth once daily 30 tablet 2  . gabapentin (NEURONTIN) 300 MG capsule Take 1 capsule (300 mg total) by mouth 2 (two) times daily 180 capsule 3  . multivitamin tablet Take 1 tablet by mouth once daily.  . potassium chloride (KLOR-CON) 10 MEQ ER tablet Take 1 tablet (10 mEq total) by mouth once daily 30 tablet 2  . predniSONE (DELTASONE) 1 MG tablet TAKE 4 TABLETS (4 MG TOTAL) BY MOUTH ONCE DAILY. 3   No current facility-administered medications for this visit.   Allergies Amoxicillin; Ampicillin; Augmentin [amoxicillin-pot clavulanate]; Ceclor [cefaclor]; Ciprofloxacin; Clindamycin; Erythromycin; Floxin [ofloxacin]; Hydrocodone; Imipramine hcl; Oraxyl [doxycycline hyclate]; Seldane [terfenadine]; Sulfamethoxazole-trimethoprim; Tobramycin; Adhesive  tape-silicones; Doxycycline; Propoxyphene; and Sulfa (sulfonamide antibiotics)  Social and Family History  Social History reports that she has never smoked. She has never used smokeless tobacco. She reports that she does not drink alcohol or use drugs.  Family History family history includes Breast cancer in her sister; Heart failure in her mother; Prostate cancer in her father.   Review of Systems   Review of Systems: The patient denies chest pain, shortness of breath, orthopnea, paroxysmal nocturnal dyspnea, pedal edema, palpitations, heart racing, presyncope, syncope. Review of 12 Systems is negative except as described above.  Physical Examination   Vitals:BP 132/58  Pulse (!) 38  Ht 144.8 cm (4\' 9" )  Wt 55.7 kg (122 lb 12.7 oz)  SpO2 98%  BMI 26.57 kg/m  Ht:144.8 cm (4\' 9" ) Wt:55.7 kg (122 lb 12.7 oz) WNU:UVOZ surface area is 1.5 meters squared. Body mass index is 26.57 kg/m.  HEENT: Pupils equally reactive to light and accomodation  Neck: Supple without thyromegaly, carotid pulses 2+ Lungs: clear to auscultation bilaterally; no wheezes, rales, rhonchi Heart: Bradycardic rate and rhythm. No gallops, 2/6 sem murmurs or rub Abdomen: soft nontender, nondistended, with normal bowel sounds Extremities: no cyanosis, clubbing, or edema Peripheral Pulses: 2+ in all extremities, 2+ femoral pulses bilaterally  Assessment   83 y.o. female with  1. Mobitz type 2 second degree heart block  2. Bradycardia  3. PMR (polymyalgia rheumatica) (CMS-HCC)  4. Shortness of  breath  5. Edema of both legs   Plan  1 Mobitz type II second-degree heart block recommend permanent pacemaker dual-chamber 2 profound bradycardia would recommend permanent pacemaker to help with symptom management 3 shortness of breath dyspnea consider inhalers recommend diuretics 4 lower extremity edema bilaterally 2+ recommend Lasix 5 polymyalgia rheumatica continue steroid therapy follow-up with rheumatology 6  arrange outpatient permanent pacemaker placement with Dr. Saralyn Pilar 7 have the patient follow-up with me in 1 to 2 weeks  No follow-ups on file.  Kelli Prince [place], Kelli [name]  This dictation was prepared with dragon dictation. Any transcription errors that result from this process are unintentional.   Electronically signed by Kelli [name], Kelli [name] at 10/09/2017 9:17 AM EDT  Plan of Treatment - documented as of this encounter  Not on file  Goals - documented as of this encounter  Goal Patient Goal Type Associated Problems Recent Progress Patient-Stated? Author  Patient Goals  General   Yes Kelli [name], Kelli Liner, Kelli [name]  Note:   "date a guy 83 years younger"   Visit Diagnoses - documented in this encounter  Diagnosis  Mobitz type 2 second degree heart block - Primary  Mobitz (type) II atrioventricular block   Bradycardia  Other specified cardiac dysrhythmias   PMR (polymyalgia rheumatica) (CMS-HCC)  Polymyalgia rheumatica   Shortness of breath   Edema of both legs  Edema   Historical Medications - added in this encounter  This list may reflect changes made after this encounter.  Medication Sig Dispensed Refills Start Date End Date  nitrofurantoin, macrocrystal-monohydrate, (MACROBID) 100 MG capsule  Take by mouth  0 10/07/2017 10/14/2017  Images Patient Contacts   Contact Name Contact Address Communication Relationship to Patient  Kelli [name] Unknown [phone] [employer]) Son or Daughter, Emergency Contact  Document Information  Primary Care Provider Other Service Providers Document Coverage Dates  Kelli [name], Kelli [name] (Apr. 28, 2015April 28, 2015 - Present) DM: [id number] [phone] (Work) [phone] (Fax) [phone] [facility] [place], [place] [address] Family Medicine  Sep. 18, 2019September 18, 2019   [place] [address] [place], [place] [address]    Encounter Providers Encounter Date  Kelli Aida Raider, Kelli [name] (Attending) [phone] (Work) [phone] (Fax) [place], [place] [address] Cardiovascular Disease Sep. 18, 2019September 18, 2019    Show All Sections

## 2017-10-23 NOTE — Transfer of Care (Signed)
Immediate Anesthesia Transfer of Care Note  Patient: Kelli [name]  Procedure(s) Performed: INSERTION PACEMAKER-DUAL CHAMBER INITIAL (N/A )  Patient Location: PACU  Anesthesia Type:General  Level of Consciousness: awake and alert   Airway & Oxygen Therapy: Patient Spontanous Breathing and Patient connected to nasal cannula oxygen  Post-op Assessment: Report given to RN and Post -op Vital signs reviewed and stable  Post vital signs: Reviewed and stable  Last Vitals:  Vitals Value Taken Time  BP 102/80 10/23/2017  1:47 PM  Temp 36.3 C 10/23/2017  1:47 PM  Pulse 62 10/23/2017  1:49 PM  Resp 14 10/23/2017  1:48 PM  SpO2 99 % 10/23/2017  1:49 PM  Vitals shown include unvalidated device data.  Last Pain:  Vitals:   10/23/17 1347  TempSrc: Temporal  PainSc:          Complications: No apparent anesthesia complications

## 2017-10-23 NOTE — Interval H&P Note (Signed)
History and Physical Interval Note:  10/23/2017 12:01 PM  Kelli [name]  has presented today for surgery, with the diagnosis of BRADYCARDIA  The various methods of treatment have been discussed with the patient and family. After consideration of risks, benefits and other options for treatment, the patient has consented to  Procedure(s): INSERTION PACEMAKER-DUAL CHAMBER INITIAL (N/A) as a surgical intervention .  The patient's history has been reviewed, patient examined, no change in status, stable for surgery.  I have reviewed the patient's chart and labs.  Questions were answered to the patient's satisfaction.     Montie Gelardi [employer]

## 2017-10-24 ENCOUNTER — Encounter: Payer: Self-pay | Admitting: Cardiology

## 2017-10-24 DIAGNOSIS — R001 Bradycardia, unspecified: Secondary | ICD-10-CM | POA: Diagnosis not present

## 2017-10-24 NOTE — Care Management Note (Signed)
Case Management Note  Patient Details  Name: MARAJADE LEI MRN: [id number] Date of Birth: 1934-11-22  Subjective/Objective:      Patient had pacemaker placed.  Will discharge today to her son's home for a couple of days and then will return to her home where she resides alone.  She is independent.  No issues with obtaining medications or transportation.  Dr. Loney Hering is her PCP.  She is on room air and no services in the home.       Action/Plan:No needs identified by the care management team.              Expected Discharge Date:  10/24/17               Expected Discharge Plan:  Home/Self Care  In-House Referral:     Discharge planning Services  CM Consult  Post Acute Care Choice:    Choice offered to:     DME Arranged:    DME Agency:     HH Arranged:    [place] Agency:     Status of Service:  Completed, signed off  If discussed at [employer] of Stay Meetings, dates discussed:    Additional Comments:  Elza Rafter, RN 10/24/2017, 9:17 AM

## 2017-10-24 NOTE — Discharge Summary (Signed)
Physician Discharge Summary  Patient ID: Kelli [name] MRN: [id number] DOB/AGE: 1934/04/21 83 y.o.  Admit date: 10/23/2017 Discharge date: 10/24/2017  Primary Discharge Diagnosis Type II second degree AV bnlock Secondary Discharge Diagnosis bradycardia  Significant Diagnostic Studies: yes  Consults: None  Hospital Course: The patient underwent elective dual-chamber pacemaker implantation on 10//2019.  The patient was returned to telemetry floor where she had an uncomplicated hospital stay.  Telemetry revealed atrial and ventricular pacing.  Chest x-ray did not reveal evidence for pneumothorax.  On the morning of 10/25/2017.  Patient was doing well, and discharged home.   Discharge Exam: Blood pressure (!) 150/54, pulse 74, temperature 98.2 F (36.8 C), resp. rate 18, height 4\' 11"  (1.499 m), weight 54.4 kg, SpO2 96 %.   General appearance: alert Head: Normocephalic, without obvious abnormality, atraumatic Eyes: conjunctivae/corneas clear. PERRL, EOM's intact. Fundi benign. Ears: normal TM's and external ear canals both ears Nose: Nares normal. Septum midline. Mucosa normal. No drainage or sinus tenderness. Throat: lips, mucosa, and tongue normal; teeth and gums normal Neck: no adenopathy, no carotid bruit, no JVD, supple, symmetrical, trachea midline and thyroid not enlarged, symmetric, no tenderness/mass/nodules Back: symmetric, no curvature. ROM normal. No CVA tenderness. Resp: clear to auscultation bilaterally Cardio: regular rate and rhythm, S1, S2 normal, no murmur, click, rub or gallop GI: soft, non-tender; bowel sounds normal; no masses,  no organomegaly Extremities: extremities normal, atraumatic, no cyanosis or edema Pulses: 2+ and symmetric Skin: Skin color, texture, turgor normal. No rashes or lesions Lymph nodes: Cervical, supraclavicular, and axillary nodes normal. Neurologic: Grossly normal Incision/Wound: Incision site appears to be well-healing Labs:   Lab  Results  Component Value Date   WBC 9.1 10/16/2017   HGB 12.7 10/16/2017   HCT 37.9 10/16/2017   MCV 94.0 10/16/2017   PLT 115 (L) 10/16/2017   No results for input(s): NA, K, CL, CO2, BUN, CREATININE, CALCIUM, PROT, BILITOT, ALKPHOS, ALT, AST, GLUCOSE in the last 168 hours.  Invalid input(s): LABALBU    Radiology: No pneumothorax EKG:   FOLLOW UP PLANS AND APPOINTMENTS  Allergies as of 10/24/2017      Reactions   Amoxicillin Rash, Other (See Comments)   Has patient had a PCN reaction causing immediate rash, facial/tongue/throat swelling, SOB or lightheadedness with hypotension: Yes Has patient had a PCN reaction causing severe rash involving mucus membranes or skin necrosis: No Has patient had a PCN reaction that required hospitalization: No Has patient had a PCN reaction occurring within the last 10 years: No If all of the above answers are "NO", then may proceed with Cephalosporin use.   Amoxicillin-pot Clavulanate Rash   Ampicillin Rash   Cefaclor Rash   Ciprofloxacin Rash   Clindamycin/lincomycin Diarrhea, Rash   Doxycycline Rash   Erythromycin Ethylsuccinate Rash   Hydrocodone Nausea And Vomiting   Very sensitive to it.Very sleepy   Imipramine Hcl Rash   Ofloxacin Rash   Propoxyphene Rash   Sulfa Antibiotics Rash   Tape Rash, Other (See Comments)   adhesive   Terfenadine Rash   Tobramycin Rash      Medication List    TAKE these medications   gabapentin 300 MG capsule Commonly known as:  NEURONTIN Take 300 mg by mouth 2 (two) times daily.   predniSONE 1 MG tablet Commonly known as:  DELTASONE Take 4 mg by mouth daily.      Follow-up Information    Kelli Kida, MD Follow up.   Specialties:  Cardiology, Internal Medicine  Contact information: [place] [address] [phone]           BRING ALL MEDICATIONS WITH YOU TO FOLLOW UP APPOINTMENTS  Time spent with patient to include physician time: 25  minutes Signed:  Isaias Cowman MD, PhD, [facility] 10/24/2017, 7:31 AM

## 2017-10-24 NOTE — Plan of Care (Signed)

## 2017-10-24 NOTE — Discharge Instructions (Signed)
May remove outer bandage on 10/25/2017.  Leave Steri-Strips on.  May shower 10/25/2017.  Do not lift left arm above head.

## 2017-10-24 NOTE — Progress Notes (Signed)
Discharged to home with son.  No new medications.  Instructions given re limitations after pacemaker insertion.  Follow up appointments made.

## 2017-10-29 NOTE — Anesthesia Postprocedure Evaluation (Signed)
Anesthesia Post Note  Patient: Kelli [name]  Procedure(s) Performed: INSERTION PACEMAKER-DUAL CHAMBER INITIAL (N/A )  Patient location during evaluation: PACU Anesthesia Type: General Level of consciousness: awake and alert Pain management: pain level controlled Vital Signs Assessment: post-procedure vital signs reviewed and stable Respiratory status: spontaneous breathing, nonlabored ventilation and respiratory function stable Cardiovascular status: blood pressure returned to baseline and stable Postop Assessment: no apparent nausea or vomiting Anesthetic complications: no     Last Vitals:  Vitals:   10/24/17 0332 10/24/17 0810  BP:  (!) 135/50  Pulse: 74 66  Resp:    Temp:  36.6 C  SpO2:  97%    Last Pain:  Vitals:   10/24/17 0810  TempSrc: Oral  PainSc:                  Alphonsus Sias

## 2017-11-04 ENCOUNTER — Encounter: Payer: Self-pay | Admitting: Cardiology

## 2017-12-02 ENCOUNTER — Ambulatory Visit: Payer: Medicare Other | Admitting: Obstetrics and Gynecology

## 2017-12-02 ENCOUNTER — Other Ambulatory Visit (HOSPITAL_COMMUNITY)
Admission: RE | Admit: 2017-12-02 | Discharge: 2017-12-02 | Disposition: A | Discharge status: Home / Self Care | Payer: Medicare Other | Source: Ambulatory Visit | Attending: Obstetrics and Gynecology | Admitting: Obstetrics and Gynecology

## 2017-12-02 VITALS — BP 134/77 | Ht 59.0 in | Wt 122.5 lb

## 2017-12-02 DIAGNOSIS — N909 Noninflammatory disorder of vulva and perineum, unspecified: Secondary | ICD-10-CM | POA: Diagnosis present

## 2017-12-02 DIAGNOSIS — N9489 Other specified conditions associated with female genital organs and menstrual cycle: Secondary | ICD-10-CM

## 2017-12-02 NOTE — Progress Notes (Signed)
Chief complaint: 1.  Vulvar burning 2.  Vulvar disorder-suspicious for lichen sclerosus  Patient presents for vulvar biopsy.  OBJECTIVE: BP 134/77   Ht 4\' 11"  (1.499 m)   Wt 122 lb 8 oz (55.6 kg)   BMI 24.74 kg/m  PELVIC:             External Genitalia: Atrophic labia majora and minora with agglutination; parchmentlike white leukoplakia noted along the left aspect of the introitus; there is symmetric hypopigmentation of the perianal region.  There is no epithelial skin breakdown.  PROCEDURE: Vulvar biopsy Verbal consent is obtained.  Patient is placed in the dorsolithotomy position.  The vulva is painted with Betadine swabs.  The left labia majora is injected with 1% lidocaine without epinephrine-2 cc.  4 mm punch biopsy is obtained and sent to pathology.  Hemostasis is managed with a silver nitrate stick.  Procedure is well-tolerated.  Bleeding is minimal.  Postprocedure instructions are given.  ASSESSMENT: 1.  Vulvar burning 2.  Chronic vulvitis 3.  Physical changes consistent with probable lichen sclerosus  PLAN: 1.  Vulvar biopsy as noted 2.  Return in 1 week for follow-up on pathology and further management planning 3.  Postprocedure instructions are given.  Brayton Mars, MD  Note: This dictation was prepared with Dragon dictation along with smaller phrase technology. Any transcriptional errors that result from this process are unintentional.

## 2017-12-02 NOTE — Patient Instructions (Signed)
1.  Vulvar biopsy is done today. 2.  Return in 1 week for follow-up on biopsy and further management planning  VULVAR BIOPSY POST-PROCEDURE INSTRUCTIONS  1. You may take Ibuprofen, Aleve or Tylenol for pain if needed.    2. You may have a small amount of spotting.  You should wear a mini pad for the next few days.  3. You may use some topical Neosporin ointment if you would like (over the counter is fine).  4. You need to call if you have redness around the biopsy site, if there is any unusual draining, if the bleeding is heavy, or if you are concerned.  5. Shower or bathe as normal  6. We will discuss the results at your follow-up appointment.

## 2017-12-10 ENCOUNTER — Encounter: Payer: Self-pay | Admitting: Obstetrics and Gynecology

## 2017-12-10 ENCOUNTER — Ambulatory Visit (INDEPENDENT_AMBULATORY_CARE_PROVIDER_SITE_OTHER): Payer: Medicare Other | Admitting: Obstetrics and Gynecology

## 2017-12-10 VITALS — BP 136/69 | Ht 59.0 in | Wt 121.0 lb

## 2017-12-10 DIAGNOSIS — L900 Lichen sclerosus et atrophicus: Secondary | ICD-10-CM

## 2017-12-10 MED ORDER — CLOBETASOL PROPIONATE 0.05 % EX OINT: TOPICAL_OINTMENT | CUTANEOUS | 1 refills | Status: DC

## 2017-12-10 NOTE — Progress Notes (Signed)
Chief complaint: 1.  Chronic vulvitis 2.  Follow-up on biopsy  Kelli [name] presents today for follow-up on vulvar biopsy due to chronic burning.  Clinical exam is suspicious for lichen sclerosus. Patient has done well since the biopsy with only mild localized discomfort.  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  OBJECTIVE: BP 136/69   Ht 4\' 11"  (1.499 m)   Wt 121 lb (54.9 kg)   BMI 24.44 kg/m  Pleasant well-appearing female no acute distress.  Alert and oriented.  Affect is appropriate. Pelvic exam:External Genitalia:Atrophic labia majora and minora with agglutination; parchmentlike white leukoplakia noted along the left aspect of the introitus; there is symmetric hypopigmentation of the perianal region. There is no epithelial skin breakdown. Biopsy site of left labia majora is crusted and healing, non-weeping.  ASSESSMENT: 1.  Long history of chronic vulvitis. 2.  Clinical exam consistent with lichen sclerosis 3.  Biopsy pathology report-lichen sclerosis et atrophicus  PLAN: 1.  Begin Temovate ointment 0.05% topically to the vulva twice a day for 2 weeks followed by daily application for 2 weeks 2.  Return in 4 weeks for follow-up with Dr. Marcelline Mates 3.  Patient reports having occasional light bloody discharge associated with activities such as dancing or playing ping-pong; I recommended that the patient contact us when she has an episode of the spotting so we may evaluate it acutely.  A total of 15 minutes were spent face-to-face with the patient during this encounter and over half of that time dealt with counseling and coordination of care.  Brayton Mars, MD  Note: This dictation was prepared with Dragon dictation along with smaller phrase technology. Any transcriptional errors that result from this process are unintentional.

## 2017-12-10 NOTE — Patient Instructions (Signed)
1.  Biopsy shows lichen sclerosis. 2.  Begin Temovate ointment 0.05% topically to the vulva as directed twice a day for 2 weeks, then daily for 2 weeks. 3.  Return in 4 weeks to see Dr. Marcelline Kelli [name] in follow-up.  Clobetasol Propionate Topical Ointment What is this medicine? CLOBETASOL (kloe BAY ta sol) is a corticosteroid. It is used on the skin to treat itching, redness, and swelling caused by some skin conditions. This medicine may be used for other purposes; ask your health care provider or pharmacist if you have questions. COMMON BRAND NAME(S): Cormax, Embeline, Temovate, Temovate E What should I tell my health care provider before I take this medicine? They need to know if you have any of these conditions: -any type of active infection including measles, tuberculosis, herpes, or chickenpox -circulation problems or vascular disease -large areas of burned or damaged skin -rosacea -skin wasting or thinning -an unusual or allergic reaction to clobetasol, corticosteroids, other medicines, foods, dyes, or preservatives -pregnant or trying to get pregnant -breast-feeding How should I use this medicine? This medicine is for external use only. Do not take by mouth. Follow the directions on the prescription label. Wash your hands before and after use. Apply a thin film of medicine to the affected area. Do not cover with a bandage or dressing unless your doctor or health care professional tells you to. Do not get this medicine in your eyes. If you do, rinse out with plenty of cool tap water. It is important not to use more medicine than prescribed. Do not use your medicine more often than directed. To do so may increase the chance of side effects. Talk to your pediatrician regarding the use of this medicine in children. Special care may be needed. Elderly patients are more likely to have damaged skin through aging, and this may increase side effects. This medicine should only be used for brief periods and  infrequently in older patients. Overdosage: If you think you have taken too much of this medicine contact a poison control center or emergency room at once. NOTE: This medicine is only for you. Do not share this medicine with others. What if I miss a dose? If you miss a dose, use it as soon as you can. If it is almost time for your next dose, use only that dose. Do not use double or extra doses without advice. What may interact with this medicine? Interactions are not expected. Do not use cosmetics or other skin care products on the treated area. This list may not describe all possible interactions. Give your health care provider a list of all the medicines, herbs, non-prescription drugs, or dietary supplements you use. Also tell them if you smoke, drink alcohol, or use illegal drugs. Some items may interact with your medicine. What should I watch for while using this medicine? Tell your doctor or health care professional if your symptoms do not get better within 2 weeks, or if you develop skin irritation from the medicine. Tell your doctor or health care professional if you are exposed to anyone with measles or chickenpox, or if you develop sores or blisters that do not heal properly. What side effects may I notice from receiving this medicine? Side effects that you should report to your doctor or health care professional as soon as possible: -allergic reactions like skin rash, itching or hives, swelling of the face, lips, or tongue -changes in vision -lack of healing of the skin condition -painful, red, pus filled blisters on the  skin or in hair follicles -thinning of the skin with easy bruising Side effects that usually do not require medical attention (report to your doctor or health care professional if they continue or are bothersome): -burning, irritation of the skin -redness or scaling of the skin This list may not describe all possible side effects. Call your doctor for medical advice  about side effects. You may report side effects to FDA at 1-800-FDA-1088. Where should I keep my medicine? Keep out of the reach of children. Store at room temperature between 15 and 30 degrees C (59 and 86 degrees F). Keep away from heat and direct light. Do not freeze. Throw away any unused medicine after the expiration date. NOTE: This sheet is a summary. It may not cover all possible information. If you have questions about this medicine, talk to your doctor, pharmacist, or health care provider.  2018 Elsevier/Gold Standard (5009-38-18 29:93:71)  Lichen Sclerosus Lichen sclerosus is a skin problem. It can happen on any part of the body, but it commonly involves the anal or genital areas. It can cause itching and discomfort in these areas. Treatment can help to control symptoms. When the genital area is affected, getting treatment is important because the condition can cause scarring that may lead to other problems. What are the causes? The cause of this condition is not known. It could be the result of an overactive immune system or a lack of certain hormones. Lichen sclerosus is not an infection or a fungus. It is not passed from one person to another (not contagious). What increases the risk? This condition is more likely to develop in women, usually after menopause. What are the signs or symptoms? Symptoms of this condition include:  Thin, wrinkled, white areas on the skin.  Thickened white areas on the skin.  Red and swollen patches (lesions) on the skin.  Tears or cracks in the skin.  Bruising.  Blood blisters.  Severe itching.  You may also have pain, itching, or burning with urination. Constipation is also common in people with lichen sclerosus. How is this diagnosed? This condition may be diagnosed with a physical exam. In some cases, a tissue sample (biopsy sample) may be removed to be looked at under a microscope. How is this treated? This condition is usually treated  with medicated creams or ointments (topical steroids) that are applied over the affected areas. Follow these instructions at home:  Take over-the-counter and prescription medicines only as told by your health care provider.  Use creams or ointments as told by your health care provider.  Do not scratch the affected areas of skin.  Women should keep the vaginal area as clean and dry as possible.  Keep all follow-up visits as told by your health care provider. This is important. Contact a health care provider if:  You have increasing redness, swelling, or pain in the affected area.  You have fluid, blood, or pus coming from the affected area.  You have new lesions on your skin.  You have a fever.  You have pain during sex. This information is not intended to replace advice given to you by your health care provider. Make sure you discuss any questions you have with your health care provider. Document Released: 05/30/2010 Document Revised: 06/15/2015 Document Reviewed: 04/04/2014 Elsevier Interactive Patient Education  [employer].

## 2018-01-12 ENCOUNTER — Ambulatory Visit: Payer: Medicare Other | Admitting: Podiatry

## 2018-01-16 ENCOUNTER — Encounter: Payer: Self-pay | Admitting: Obstetrics and Gynecology

## 2018-01-16 ENCOUNTER — Ambulatory Visit: Payer: Medicare Other | Admitting: Obstetrics and Gynecology

## 2018-01-16 VITALS — BP 152/85 | HR 85 | Ht <= 58 in | Wt 124.9 lb

## 2018-01-16 DIAGNOSIS — N904 Leukoplakia of vulva: Secondary | ICD-10-CM | POA: Diagnosis not present

## 2018-01-16 DIAGNOSIS — N952 Postmenopausal atrophic vaginitis: Secondary | ICD-10-CM

## 2018-01-16 NOTE — Patient Instructions (Addendum)
Continue Temovate ointment to vulva twice weekly for 3 months.  Start Intrarosa tablets vaginally once daily (can insert in morning or at night)    Atrophic Vaginitis Atrophic vaginitis is a condition in which the tissues that line the vagina become dry and thin. This condition occurs in women who have stopped having their period. It is caused by a drop in a female hormone (estrogen). This hormone helps:  To keep the vagina moist.  To make a clear fluid. This clear fluid helps: ? To make the vagina ready for sex. ? To protect the vagina from infection. If the lining of the vagina is dry and thin, it may cause irritation, burning, or itchiness. It may also:  Make sex painful.  Make an exam of your vagina painful.  Cause bleeding.  Make you lose interest in sex.  Cause a burning feeling when you pee (urinate).  Cause a brown or yellow fluid to come from your vagina. Some women do not have symptoms. Follow these instructions at home: Medicines  Take over-the-counter and prescription medicines only as told by your doctor.  Do not use herbs or other medicines unless your doctor says it is okay.  Use medicines for for dryness. These include: ? Oils to make the vagina soft. ? Creams. ? Moisturizers. General instructions  Do not douche.  Do not use products that can make your vagina dry. These include: ? Scented sprays. ? Scented tampons. ? Scented soaps.  Sex can help increase blood flow and soften the tissue in the vagina. If it hurts to have sex: ? Tell your partner. ? Use products to make sex more comfortable. Use these only as told by your doctor. Contact a doctor if you:  Have discharge from the vagina that is different than usual.  Have a bad smell coming from your vagina.  Have new symptoms.  Do not get better.  Get worse. Summary  Atrophic vaginitis is a condition in which the lining of the vagina becomes dry and thin.  This condition affects women  who have stopped having their periods.  Treatment may include using products that help make the vagina soft.  Call a doctor if do not get better with treatment. This information is not intended to replace advice given to you by your health care provider. Make sure you discuss any questions you have with your health care provider. Document Released: 06/26/2007 Document Revised: 01/20/2017 Document Reviewed: 01/20/2017 Elsevier Interactive Patient Education  2019 Elsevier Inc.   Lichen Sclerosus Lichen sclerosus is a skin problem. It can happen on any part of the body. It happens most often in the anal or genital areas. It can cause itching and discomfort. Treatment can help to control symptoms. It can also help prevent scarring that may lead to other problems. What are the causes? The cause of this condition is not known. It is not passed from one person to another (not contagious). What increases the risk? This condition is more likely to develop in women. It most often occurs after menopause. What are the signs or symptoms? Symptoms of this condition include:  Thin, wrinkled, white areas on the skin.  Thickened white areas on the skin.  Red and swollen patches (lesions) on the skin.  Tears or cracks in the skin.  Bruising.  Blood blisters.  Very bad itching.  Pain, itching, or burning when peeing (urinating).  Trouble pooping (constipation). How is this diagnosed? This condition may be diagnosed with a physical exam. A sample  of your skin may also be removed to be looked at under a microscope (biopsy). How is this treated? This condition may be treated with:  Creams or ointments (topical steroids) that are put on the skin in the affected areas. This is the most common treatment.  Medicines that are taken by mouth.  Surgery. This is only needed if the condition is very bad and is causing problems such as scarring. Follow these instructions at home:  Take or use  over-the-counter and prescription medicines only as told by your doctor.  Use creams or ointments as told by your doctor.  Do not scratch the affected areas of skin.  If you are a woman, keep the vagina as clean and dry as you can.  Clean the affected area of skin gently with water. Avoid using rough towels or toilet paper.  Keep all follow-up visits as told by your doctor. This is important. Contact a doctor if:  Your redness, swelling, or pain gets worse.  You have fluid, blood, or pus coming from the area.  You have new red and swollen patches on your skin.  You have a fever.  You have pain during sex. Summary  Lichen sclerosus is a skin problem. It can cause itching and discomfort.  This condition is usually treated with creams or ointments that are put on the skin in the affected areas.  Use medicines only as told by your doctor.  Do not scratch the affected areas of skin.  Keep all follow-up visits as told by your doctor. This is important. This information is not intended to replace advice given to you by your health care provider. Make sure you discuss any questions you have with your health care provider. Document Released: 12/21/2007 Document Revised: 05/22/2017 Document Reviewed: 05/22/2017 Elsevier Interactive Patient Education  2019 [employer].

## 2018-01-16 NOTE — Progress Notes (Signed)
    GYNECOLOGY CLINIC PROGRESS NOTE  Subjective:    Patient ID: Kelli [name], female    DOB: Jun 14, 1934, 83 y.o.   MRN: [id number]  HPI  Patient is a 83 y.o. G5P2002 female who presents for 4 week follow up of lichen sclerosis, currently being treated with Temovate ointment.  Patient reports her symptoms are not much better. Still feeling some burning externally as well as internally. Also notes occasional spotting when wiping.   The following portions of the patient's history were reviewed and updated as appropriate: allergies, current medications, past family history, past medical history, past social history, past surgical history and problem list.  Review of Systems Pertinent items noted in HPI and remainder of comprehensive ROS otherwise negative.   Objective:   Blood pressure (!) 152/85, pulse 85, height 4\' 10"  (1.473 m), weight 124 lb 14.4 oz (56.7 kg). General appearance: alert and no distress Pelvic:  URETHRA: urethral caruncle present VULVA: vulvar hypopigmentation bilaterally VAGINA: moderate to severe atrophic,  CERVIX: normal appearing cervix without discharge or lesions, cervical stenosis present UTERUS: uterus is normal size, shape, consistency and nontender, ADNEXA: normal adnexa in size, nontender and no masses RECTAL: rectal exam not indicated.   Assessment:   Lichen sclerosis Vaginal atrophy  Plan:   - Patient to continue treatment of Temovate for a total of 16 weeks. After this time, she can treat as needed.  - Discussion had on management options for vaginal atrophy, including vaginal estrogen tablet, ring, or cream; or vaginal testosterone inserts. Due to the severity of her atrophy, vaginal moisturizers would likely lead to sub-optimal symptom relief. Patient opted for vaginal testosterone with Intrarosa.  Given 1 month supply in samples.  To follow up in 1 month to reassess symptoms.   [name], MD [facility]

## 2018-01-16 NOTE — Progress Notes (Signed)
Pt is present today for a follow up for lichen sclerosus. Pt stated that she thinks she may still need some other type of medication due to still having pain and bleeding from the vaginal area.

## 2018-01-26 ENCOUNTER — Ambulatory Visit: Payer: Medicare Other | Admitting: Podiatry

## 2018-01-26 ENCOUNTER — Encounter: Payer: Self-pay | Admitting: Podiatry

## 2018-01-26 DIAGNOSIS — B351 Tinea unguium: Secondary | ICD-10-CM | POA: Diagnosis not present

## 2018-01-26 DIAGNOSIS — Q828 Other specified congenital malformations of skin: Secondary | ICD-10-CM

## 2018-01-26 DIAGNOSIS — M722 Plantar fascial fibromatosis: Secondary | ICD-10-CM

## 2018-01-26 DIAGNOSIS — M79676 Pain in unspecified toe(s): Secondary | ICD-10-CM

## 2018-01-26 NOTE — Progress Notes (Addendum)
Complaint:  Visit Type: Patient returns to my office for continued preventative foot care services. Complaint: Patient states" my nails have grown long and thick and become painful to walk and wear shoes" Patient states she is having left foot pain on occasion.  . The patient presents for preventative foot care services. No changes to ROS.  Patient had pacemaker surgery since last visit.  Podiatric Exam: Vascular: dorsalis pedis and posterior tibial pulses are palpable bilateral. Capillary return is immediate. Temperature gradient is WNL. Skin turgor WNL  Sensorium: Normal Semmes Weinstein monofilament test. Normal tactile sensation bilaterally. Nail Exam: Pt has thick disfigured discolored nails with subungual debris noted bilateral entire nail hallux through fifth toenails Ulcer Exam: There is no evidence of ulcer or pre-ulcerative changes or infection. Orthopedic Exam: Muscle tone and strength are WNL. No limitations in general ROM. No crepitus or effusions noted. Foot type and digits show no abnormalities. Bony prominences are unremarkable.   Skin: No Porokeratosis. No infection or ulcers  Diagnosis:  Onychomycosis, , Pain in right toe, pain in left toes  Treatment & Plan Procedures and Treatment: Consent by patient was obtained for treatment procedures. The patient understood the discussion of treatment and procedures well. All questions were answered thoroughly reviewed. Debridement of mycotic and hypertrophic toenails, 1 through 5 bilateral and clearing of subungual debris. No ulceration, no infection noted. Patient requests a new brace for left foot. Return Visit-Office Procedure: Patient instructed to return to the office for a follow up visit 3 months for continued evaluation and treatment.    Gardiner Barefoot DPM

## 2018-02-16 ENCOUNTER — Other Ambulatory Visit: Payer: Self-pay

## 2018-02-17 ENCOUNTER — Encounter: Payer: Self-pay | Admitting: Obstetrics and Gynecology

## 2018-02-17 ENCOUNTER — Ambulatory Visit: Payer: Medicare Other | Admitting: Obstetrics and Gynecology

## 2018-02-17 VITALS — BP 141/65 | HR 77 | Ht <= 58 in | Wt 127.2 lb

## 2018-02-17 DIAGNOSIS — R399 Unspecified symptoms and signs involving the genitourinary system: Secondary | ICD-10-CM

## 2018-02-17 DIAGNOSIS — N904 Leukoplakia of vulva: Secondary | ICD-10-CM

## 2018-02-17 DIAGNOSIS — N952 Postmenopausal atrophic vaginitis: Secondary | ICD-10-CM

## 2018-02-17 LAB — POCT URINALYSIS DIPSTICK
Bilirubin, UA: NEGATIVE
Glucose, UA: NEGATIVE
Ketones, UA: NEGATIVE
Nitrite, UA: NEGATIVE
Protein, UA: NEGATIVE
Spec Grav, UA: <=1.005 — AB (ref 1.010–1.025)
Urobilinogen, UA: 0.2 E.U./dL
pH, UA: 8 (ref 5.0–8.0)

## 2018-02-17 MED ORDER — ESTROGENS, CONJUGATED 0.625 MG/GM VA CREA: 1.0000 | TOPICAL_CREAM | VAGINAL | 4 refills | Status: DC

## 2018-02-17 NOTE — Progress Notes (Signed)
    GYNECOLOGY PROGRESS NOTE  Subjective:    Patient ID: Kelli [name], female    DOB: May 18, 1934, 84 y.o.   MRN: [id number]  HPI  Patient is a 84 y.o. G68P2002 female who presents for 1 month f/u for vaginal atrophy.  She was initiated on Intrarosa last visit. She also has a h/o lichen sclerosis, currently on Temovate ( 8 weeks completed of 16 week treatment course). She notes that she has not noted much improvement in her symptoms. She notes that she has been noting increased urinary frequency, and is now experiencing nocturia.   Additionally, patient reports that she recently had a pacemaker placed, which she feels she has caused her blood pressures to be a little higher than usual.   The following portions of the patient's history were reviewed and updated as appropriate: allergies, current medications, past family history, past medical history, past social history, past surgical history and problem list.  Review of Systems Pertinent items noted in HPI and remainder of comprehensive ROS otherwise negative.   Objective:   Blood pressure (!) 141/65, pulse 77, height 4\' 10"  (1.473 m), weight 127 lb 3.2 oz (57.7 kg). General appearance: alert and no distress Abdomen: soft, non-tender; bowel sounds normal; no masses,  no organomegaly. No suprapubic tenderness.  Pelvic:  URETHRA: urethral caruncle present VULVA: vulvar hypopigmentation bilaterally VAGINA: moderately atrophic, no discharge or lesions CERVIX: normal appearing cervix without discharge or lesions, cervical stenosis present UTERUS: uterus is normal size, shape, consistency and nontender, ADNEXA: normal adnexa in size, nontender and no masses RECTAL: rectal exam not indicated.   Labs:  Results for orders placed or performed in visit on 02/17/18  POCT urinalysis dipstick  Result Value Ref Range   Color, UA light yellow    Clarity, UA clear    Glucose, UA Negative Negative   Bilirubin, UA neg    Ketones, UA neg    Spec  Grav, UA <=1.005 (A) 1.010 - 1.025   Blood, UA moderate +2    pH, UA 8.0 5.0 - 8.0   Protein, UA Negative Negative   Urobilinogen, UA 0.2 0.2 or 1.0 E.U./dL   Nitrite, UA neg    Leukocytes, UA Moderate (2+) (A) Negative   Appearance yellow    Odor      Assessment:   Vaginal atrophy Lichen sclerosis UTI symptoms   Plan:   - Patient not noting much change with Intrarosa. Changed to Premarin from Intrarosa . - UA with moderate leukocytes and some blood (unsure if it was due to recent vaginal exam or if true hematuria). Will send for culture and treat accordingly.  - F/u in 1 month on vaginal atrophy and lichen sclerosis    [name], MD [facility]

## 2018-02-17 NOTE — Progress Notes (Signed)
Pt is present today for a follow up from lichen sclerosis and vaginal atrophy. Pt stated that her symptoms are still the same and there is no improvement. Pt stated that she is having increased urination.

## 2018-02-19 LAB — URINE CULTURE

## 2018-02-23 ENCOUNTER — Encounter: Payer: Self-pay | Admitting: Cardiology

## 2018-02-26 ENCOUNTER — Telehealth: Payer: Self-pay | Admitting: Obstetrics and Gynecology

## 2018-02-26 NOTE — Telephone Encounter (Signed)
Patient called with questions regarding AZO. She would like a call back. Thanks

## 2018-02-26 NOTE — Telephone Encounter (Signed)
error 

## 2018-02-27 NOTE — Telephone Encounter (Signed)
Pt was called back and stated that she think she maybe allergic to the AZO because after taking it she had cramping and really dark diarrhea. Pt was advised to not take anymore of the medication. Pt was advised to continue drinking plenty of water, cranberry juice, wipe from front to back, and use a probiotic otc. Pt stated that she would try that.

## 2018-03-11 ENCOUNTER — Telehealth: Payer: Self-pay | Admitting: Obstetrics and Gynecology

## 2018-03-11 NOTE — Telephone Encounter (Signed)
I have spoken with the patient and answered her questions.  She also would like for her morning appointment to be changed to the afternoon (approximately 3 pm or later).  Not sure which date her appointment is on, but I think it is in 1-2 weeks. Please contact her if we are able to reschedule her.   Dr. Marcelline Mates

## 2018-03-11 NOTE — Telephone Encounter (Signed)
Contacted patient per request. Patient notes that she has tried to use the Premarin cream recently prescribed, but noting burning of the vaginal area after use.  Used a whole applicator full. Wonders if she could be allergic to it today. Also questions as to whether she should increase her use of her Temovate cream (currently using twice weekly).  Advised to decrease use of Premarin to 1/2 of an applicatorful to see if she still experiences the burning sensation as sometimes the cream can cause some local irritation initially.  If no relief, patient to continue use of the Temovate as prescribed (Don't increase dose), and we can discuss at her upcoming visit alternative treatments for her vaginal atrophy.    Patient also requests that her upcoming appointment be moved from the morning to the afternoon as she has a schedule conflict. Will arrange with front staff to call to reschedule appointment.    [name], MD [facility]

## 2018-03-11 NOTE — Telephone Encounter (Signed)
The patient called and stated that she needs to speak with Dr. Marcelline Mates and is requesting a call back if possible. Please advise.

## 2018-03-13 ENCOUNTER — Telehealth: Payer: Self-pay | Admitting: Obstetrics and Gynecology

## 2018-03-13 NOTE — Telephone Encounter (Signed)
The patient would like to speak with a nurse in regards to the patient needing a later apt time. Dr. Andreas Blower next schedule opening is 03/30/2018. Thank you.

## 2018-03-18 ENCOUNTER — Encounter: Payer: Medicare Other | Admitting: Obstetrics and Gynecology

## 2018-03-31 ENCOUNTER — Ambulatory Visit: Payer: Medicare Other | Admitting: Obstetrics and Gynecology

## 2018-03-31 ENCOUNTER — Encounter: Payer: Self-pay | Admitting: Obstetrics and Gynecology

## 2018-03-31 VITALS — BP 104/67 | HR 85 | Ht <= 58 in | Wt 128.3 lb

## 2018-03-31 DIAGNOSIS — L900 Lichen sclerosus et atrophicus: Secondary | ICD-10-CM | POA: Diagnosis not present

## 2018-03-31 DIAGNOSIS — N952 Postmenopausal atrophic vaginitis: Secondary | ICD-10-CM | POA: Diagnosis not present

## 2018-03-31 MED ORDER — BETAMETHASONE DIPROPIONATE AUG 0.05 % EX OINT: TOPICAL_OINTMENT | CUTANEOUS | 3 refills | Status: DC

## 2018-03-31 NOTE — Progress Notes (Signed)
Pt is present today for a f/u for vaginal atrophy and lichen sclerosis.  Pt stated that the medication given Premarin cream is not helping with the pain and issues that she currently having.

## 2018-04-01 NOTE — Progress Notes (Signed)
    GYNECOLOGY PROGRESS NOTE  Subjective:    Patient ID: Kelli [name], female    DOB: 27-May-1934, 84 y.o.   MRN: [id number]  HPI  Patient is a 84 y.o. G92P2002 female who presents for presents for follow up of lichen sclerosis and vulvar atrophy. Patient notes that she has tried increasing the Premarin due to unresolved symptoms of vulvo-vaginal itching, however it caused burning so she decreased back to original. She notes that she is still using the Temovate as prescribed but does not feel that it is helping.   The following portions of the patient's history were reviewed and updated as appropriate: allergies, current medications, past family history, past medical history, past social history, past surgical history and problem list.  Review of Systems Pertinent items noted in HPI and remainder of comprehensive ROS otherwise negative.   Objective:   Blood pressure 104/67, pulse 85, height 4\' 10"  (1.473 m), weight 128 lb 4.8 oz (58.2 kg). General appearance: alert and no distress Pelvic: URETHRA: urethral caruncle present VULVA:vulvar hypopigmentationbilaterally VAGINA:moderatelyatrophic,no discharge or lesions CERVIX:normal appearing cervix without discharge or lesions, cervical stenosis present UTERUS:uterus is normal size, shape, consistency and nontender, ADNEXA:normal adnexa in size, nontender and no masses RECTAL:rectal exam not indicated.  Assessment:   Lichen sclerosis Vaginal atrophy  Plan:   - Temovate currently ineffective, has been using for ~ 2 months with no significant improvement. Will change to Diprolene.   - Vaginal atrophy, just initiated Premarin ~ 1 month ago, advised to continue at current dose as increasing dose caused side effect of itching.  - Will f/u in 4 weeks.    [name], MD [facility]

## 2018-04-27 ENCOUNTER — Ambulatory Visit: Payer: Medicare Other | Admitting: Podiatry

## 2018-06-18 ENCOUNTER — Encounter: Payer: Self-pay | Admitting: Podiatry

## 2018-06-18 ENCOUNTER — Ambulatory Visit: Payer: Medicare Other | Admitting: Podiatry

## 2018-06-18 ENCOUNTER — Other Ambulatory Visit: Payer: Self-pay

## 2018-06-18 VITALS — Temp 97.6°F

## 2018-06-18 DIAGNOSIS — M79676 Pain in unspecified toe(s): Secondary | ICD-10-CM

## 2018-06-18 DIAGNOSIS — Q828 Other specified congenital malformations of skin: Secondary | ICD-10-CM | POA: Diagnosis not present

## 2018-06-18 DIAGNOSIS — L409 Psoriasis, unspecified: Secondary | ICD-10-CM | POA: Insufficient documentation

## 2018-06-18 DIAGNOSIS — B351 Tinea unguium: Secondary | ICD-10-CM | POA: Diagnosis not present

## 2018-06-18 NOTE — Progress Notes (Signed)
Complaint:  Visit Type: Patient returns to my office for continued preventative foot care services. Complaint: Patient states" my nails have grown long and thick and become painful to walk and wear shoes" Patient states she is having left foot pain on occasion.  . The patient presents for preventative foot care services. No changes to ROS.  Patient had pacemaker surgery since last visit.  Podiatric Exam: Vascular: dorsalis pedis and posterior tibial pulses are palpable bilateral. Capillary return is immediate. Temperature gradient is WNL. Skin turgor WNL  Sensorium: Normal Semmes Weinstein monofilament test. Normal tactile sensation bilaterally. Nail Exam: Pt has thick disfigured discolored nails with subungual debris noted bilateral entire nail hallux through fifth toenails Ulcer Exam: There is no evidence of ulcer or pre-ulcerative changes or infection. Orthopedic Exam: Muscle tone and strength are WNL. No limitations in general ROM. No crepitus or effusions noted. Foot type and digits show no abnormalities. Bony prominences are unremarkable.   Skin: No Porokeratosis. No infection or ulcers  Diagnosis:  Onychomycosis, , Pain in right toe, pain in left toes  Treatment & Plan Procedures and Treatment: Consent by patient was obtained for treatment procedures. The patient understood the discussion of treatment and procedures well. All questions were answered thoroughly reviewed. Debridement of mycotic and hypertrophic toenails, 1 through 5 bilateral and clearing of subungual debris. No ulceration, no infection noted.  Return Visit-Office Procedure: Patient instructed to return to the office for a follow up visit 3 months for continued evaluation and treatment.    Gardiner Barefoot DPM

## 2018-08-17 ENCOUNTER — Other Ambulatory Visit: Payer: Self-pay | Admitting: Family Medicine

## 2018-09-10 ENCOUNTER — Ambulatory Visit (INDEPENDENT_AMBULATORY_CARE_PROVIDER_SITE_OTHER): Payer: Medicare Other | Admitting: Podiatry

## 2018-09-10 ENCOUNTER — Other Ambulatory Visit: Payer: Self-pay

## 2018-09-10 ENCOUNTER — Encounter: Payer: Self-pay | Admitting: Podiatry

## 2018-09-10 ENCOUNTER — Ambulatory Visit: Payer: Medicare Other | Admitting: Podiatry

## 2018-09-10 VITALS — Temp 97.9°F

## 2018-09-10 DIAGNOSIS — B351 Tinea unguium: Secondary | ICD-10-CM | POA: Diagnosis not present

## 2018-09-10 DIAGNOSIS — Q828 Other specified congenital malformations of skin: Secondary | ICD-10-CM

## 2018-09-10 DIAGNOSIS — M79676 Pain in unspecified toe(s): Secondary | ICD-10-CM | POA: Diagnosis not present

## 2018-09-10 NOTE — Progress Notes (Signed)
Complaint:  Visit Type: Patient returns to my office for continued preventative foot care services. Complaint: Patient states" my nails have grown long and thick and become painful to walk and wear shoes" Patient states she is having left foot pain on occasion.  . The patient presents for preventative foot care services. No changes to ROS.  Patient had pacemaker surgery since last visit.  Podiatric Exam: Vascular: dorsalis pedis and posterior tibial pulses are palpable bilateral. Capillary return is immediate. Temperature gradient is WNL. Skin turgor WNL  Sensorium: Normal Semmes Weinstein monofilament test. Normal tactile sensation bilaterally. Nail Exam: Pt has thick disfigured discolored nails with subungual debris noted bilateral entire nail hallux through fifth toenails Ulcer Exam: There is no evidence of ulcer or pre-ulcerative changes or infection. Orthopedic Exam: Muscle tone and strength are WNL. No limitations in general ROM. No crepitus or effusions noted. Foot type and digits show no abnormalities. Bony prominences are unremarkable.   Skin:  Porokeratosis  Sub 4th right.. No infection or ulcers  Diagnosis:  Onychomycosis, , Pain in right toe, pain in left toes.  Porokeratosis right forefoot.  Treatment & Plan Procedures and Treatment: Consent by patient was obtained for treatment procedures. The patient understood the discussion of treatment and procedures well. All questions were answered thoroughly reviewed. Debridement of mycotic and hypertrophic toenails, 1 through 5 bilateral and clearing of subungual debris. No ulceration, no infection noted. Debride porokeratosis. Return Visit-Office Procedure: Patient instructed to return to the office for a follow up visit 3 months for continued evaluation and treatment.    Gardiner Barefoot DPM

## 2018-12-14 ENCOUNTER — Ambulatory Visit: Payer: Medicare Other | Admitting: Podiatry

## 2018-12-14 ENCOUNTER — Encounter: Payer: Self-pay | Admitting: Podiatry

## 2018-12-14 ENCOUNTER — Other Ambulatory Visit: Payer: Self-pay

## 2018-12-14 DIAGNOSIS — Q828 Other specified congenital malformations of skin: Secondary | ICD-10-CM | POA: Diagnosis not present

## 2018-12-14 DIAGNOSIS — B351 Tinea unguium: Secondary | ICD-10-CM | POA: Diagnosis not present

## 2018-12-14 DIAGNOSIS — M79676 Pain in unspecified toe(s): Secondary | ICD-10-CM

## 2018-12-14 NOTE — Progress Notes (Signed)
Complaint:  Visit Type: Patient returns to my office for continued preventative foot care services. Complaint: Patient states" my nails have grown long and thick and become painful to walk and wear shoes" Patient states she is having left foot pain on occasion.  . The patient presents for preventative foot care services. No changes to ROS.  Patient had pacemaker surgery since last visit.  Podiatric Exam: Vascular: dorsalis pedis and posterior tibial pulses are palpable bilateral. Capillary return is immediate. Temperature gradient is WNL. Skin turgor WNL  Sensorium: Normal Semmes Weinstein monofilament test. Normal tactile sensation bilaterally. Nail Exam: Pt has thick disfigured discolored nails with subungual debris noted bilateral entire nail hallux through fifth toenails Ulcer Exam: There is no evidence of ulcer or pre-ulcerative changes or infection. Orthopedic Exam: Muscle tone and strength are WNL. No limitations in general ROM. No crepitus or effusions noted. Foot type and digits show no abnormalities. Bony prominences are unremarkable.   Skin:  Porokeratosis  Sub 4th right.. No infection or ulcers  Diagnosis:  Onychomycosis, , Pain in right toe, pain in left toes.  Porokeratosis right forefoot.  Treatment & Plan Procedures and Treatment: Consent by patient was obtained for treatment procedures. The patient understood the discussion of treatment and procedures well. All questions were answered thoroughly reviewed. Debridement of mycotic and hypertrophic toenails, 1 through 5 bilateral and clearing of subungual debris. No ulceration, no infection noted. Debride porokeratosis. Return Visit-Office Procedure: Patient instructed to return to the office for a follow up visit 3 months for continued evaluation and treatment.    Gardiner Barefoot DPM

## 2019-03-02 DIAGNOSIS — N1831 Chronic kidney disease, stage 3a: Secondary | ICD-10-CM | POA: Insufficient documentation

## 2019-03-15 ENCOUNTER — Ambulatory Visit: Payer: Medicare Other | Admitting: Podiatry

## 2019-03-29 ENCOUNTER — Encounter: Payer: Self-pay | Admitting: Podiatry

## 2019-03-29 ENCOUNTER — Ambulatory Visit: Payer: Medicare PPO | Admitting: Podiatry

## 2019-03-29 ENCOUNTER — Other Ambulatory Visit: Payer: Self-pay

## 2019-03-29 VITALS — Temp 96.6°F

## 2019-03-29 DIAGNOSIS — Q828 Other specified congenital malformations of skin: Secondary | ICD-10-CM

## 2019-03-29 DIAGNOSIS — M79676 Pain in unspecified toe(s): Secondary | ICD-10-CM

## 2019-03-29 DIAGNOSIS — B351 Tinea unguium: Secondary | ICD-10-CM

## 2019-03-29 NOTE — Progress Notes (Signed)
Complaint:  Visit Type: Patient returns to my office for continued preventative foot care services. Complaint: Patient states" my nails have grown long and thick and become painful to walk and wear shoes"   . The patient presents for preventative foot care services. No changes to ROS.    Podiatric Exam: Vascular: dorsalis pedis and posterior tibial pulses are palpable bilateral. Capillary return is immediate. Temperature gradient is WNL. Skin turgor WNL  Sensorium: Normal Semmes Weinstein monofilament test. Normal tactile sensation bilaterally. Nail Exam: Pt has thick disfigured discolored nails with subungual debris noted bilateral entire nail hallux through fifth toenails.  ( 1-5 B/L) Ulcer Exam: There is no evidence of ulcer or pre-ulcerative changes or infection. Orthopedic Exam: Muscle tone and strength are WNL. No limitations in general ROM. No crepitus or effusions noted. Foot type and digits show no abnormalities. Bony prominences are unremarkable.   Skin:  Porokeratosis  Sub 4th right.. No infection or ulcers  Diagnosis:  Onychomycosis, , Pain in right toe, pain in left toes.  Porokeratosis right forefoot.  Treatment & Plan Procedures and Treatment: Consent by patient was obtained for treatment procedures. The patient understood the discussion of treatment and procedures well. All questions were answered thoroughly reviewed. Debridement of mycotic and hypertrophic toenails, 1 through 5 bilateral and clearing of subungual debris. No ulceration, no infection noted. Debride porokeratosis. Return Visit-Office Procedure: Patient instructed to return to the office for a follow up visit 4 months for continued evaluation and treatment.    Gardiner Barefoot DPM

## 2019-05-05 ENCOUNTER — Ambulatory Visit: Payer: Medicare PPO | Admitting: Dermatology

## 2019-05-05 ENCOUNTER — Other Ambulatory Visit: Payer: Self-pay

## 2019-05-05 DIAGNOSIS — L739 Follicular disorder, unspecified: Secondary | ICD-10-CM | POA: Diagnosis not present

## 2019-05-05 DIAGNOSIS — D2372 Other benign neoplasm of skin of left lower limb, including hip: Secondary | ICD-10-CM

## 2019-05-05 DIAGNOSIS — D1800 Hemangioma unspecified site: Secondary | ICD-10-CM | POA: Diagnosis not present

## 2019-05-05 DIAGNOSIS — D239 Other benign neoplasm of skin, unspecified: Secondary | ICD-10-CM

## 2019-05-05 NOTE — Patient Instructions (Signed)
Recommend daily broad spectrum sunscreen SPF 30+ to sun-exposed areas, reapply every 2 hours as needed. Call for new or changing lesions.  

## 2019-05-05 NOTE — Progress Notes (Addendum)
   Follow-Up Visit   Subjective  Kelli [name] is a 85 y.o. female who presents for the following: Rash (has been there since Feb. ). Rash has been present since end of Feb. 2021 after 2nd Covid-19 vaccination. Asymptomatic. Patient states it is just there.  No history of Skin cancers.  The following portions of the chart were reviewed this encounter and updated as appropriate: Tobacco  Allergies  Meds  Problems  Med Hx  Surg Hx  Fam Hx      Review of Systems: No other skin or systemic complaints.  Objective  Well appearing patient in no apparent distress; mood and affect are within normal limits.  A focused examination was performed including face, bilateral arms, bilateral legs. Relevant physical exam findings are noted in the Assessment and Plan.  Objective  left shin: Firm pink/brown papule nodule with dimple sign.   Objective  Bilat. thigh: Follicular-based erythematous papules and pustules.   Assessment & Plan  Dermatofibroma left shin  Benign-appearing.  Observation.  Call clinic for new or changing moles.  Recommend daily use of broad spectrum spf 30+ sunscreen to sun-exposed areas.    Folliculitis Bilat. thigh  Improving.   Discussed options of using Hibiclens, a dandruff shampoo, ketoconazole cream, and doxycycline.  Patient defers at this time.  Return if symptoms worsen or fail to improve.   Hemangiomas - Red papules - Discussed benign nature - Observe - Call for any changes   I, Donzetta Kohut, CMA, am acting as scribe for Forest Gleason, MD .  Documentation: I have reviewed the above documentation for accuracy and completeness, and I agree with the above.  Forest Gleason, MD

## 2019-05-14 ENCOUNTER — Telehealth: Payer: Self-pay

## 2019-05-14 NOTE — Telephone Encounter (Signed)
If she wants to use a dandruff shampoo like Pyrithione Zinc (head & shoulders), she should suds the area up with it and leave it on for 5 minutes before rinsing off. She can use it up to daily but at least 3 times a week to give it a good trial to see if it clears her rash.

## 2019-05-14 NOTE — Telephone Encounter (Signed)
Patient left a message regarding her last office visit and a question about the dandruff shampoo that she is to use to her thighs. How long is the shampoo to sit? How often does she use this?

## 2019-05-17 ENCOUNTER — Encounter: Payer: Self-pay | Admitting: Dermatology

## 2019-05-17 NOTE — Telephone Encounter (Signed)
Called patient today but no answer and unable to leave voicemail.

## 2019-05-18 NOTE — Telephone Encounter (Signed)
Patient advised of information per Dr. Moye. 

## 2019-05-18 NOTE — Telephone Encounter (Signed)
Left message for patient to return my call.

## 2019-06-22 ENCOUNTER — Other Ambulatory Visit: Payer: Self-pay

## 2019-06-22 ENCOUNTER — Ambulatory Visit: Payer: Medicare PPO | Admitting: Dermatology

## 2019-06-22 DIAGNOSIS — L739 Follicular disorder, unspecified: Secondary | ICD-10-CM

## 2019-06-22 DIAGNOSIS — L719 Rosacea, unspecified: Secondary | ICD-10-CM

## 2019-06-22 DIAGNOSIS — L300 Nummular dermatitis: Secondary | ICD-10-CM

## 2019-06-22 NOTE — Progress Notes (Signed)
   Follow-Up Visit   Subjective  Kelli [name] is a 85 y.o. female who presents for the following: folliculitis (bil thighs f/u Head and shoulders shampoo to bil legs 3x/wk) and break outs (face, prn, face gets red).   The following portions of the chart were reviewed this encounter and updated as appropriate:      Review of Systems:  No other skin or systemic complaints except as noted in HPI or Assessment and Plan.  Objective  Well appearing patient in no apparent distress; mood and affect are within normal limits.  A focused examination was performed including bil legs. Relevant physical exam findings are noted in the Assessment and Plan.  Objective  bil anterior thighs: Hyperpigmented macules bil ant thighs  Objective  L pretibia: Pink scaly patch  Objective  face: Mild erythema malar cheeks   Assessment & Plan  Folliculitis bil anterior thighs  Resolved with mild PIH D/c Head and shoulders, may restart if flares    Nummular dermatitis L pretibia  Recommend mild soap and moisturizing cream 1-2 times daily.    Cont Aveeno qd/bid D/C [place], start [place] for sensitive skin Start HC 1%cr qd/bid prn itch  Rosacea face  Mild Recommend SPF qd  Return if symptoms worsen or fail to improve.  I, Othelia Pulling, RMA, am acting as scribe for Brendolyn Patty, MD .  Documentation: I have reviewed the above documentation for accuracy and completeness, and I agree with the above.  Brendolyn Patty MD

## 2019-06-22 NOTE — Patient Instructions (Addendum)
Dry Skin Care  What causes dry skin?  Dry skin is common and results from inadequate moisture in the outer skin layers. Dry skin usually results from the excessive loss of moisture from the skin surface. This occurs due to two major factors: 1. Normally the skin's oil glands deposit a layer of oil on the skin's surface. This layer of oil prevents the loss of moisture from the skin. Exposure to soaps, cleaners, solvents, and disinfectants removes this oily film, allowing water to escape. 2. Water loss from the skin increases when the humidity is low. During winter months we spend a lot of time indoors where the air is heated. Heated air has very low humidity. This also contributes to dry skin.  A tendency for dry skin may accompany such disorders as eczema. Also, as people age, the number of functioning oil glands decreases, and the tendency toward dry skin can be a sensation of skin tightness when emerging from the shower.  How do I manage dry skin?  1. Humidify your environment. This can be accomplished by using a humidifier in your bedroom at night during winter months. 2. Bathing can actually put moisture back into your skin if done right. Take the following steps while bathing to sooth dry skin:  Avoid hot water, which only dries the skin and makes itching worse. Use warm water.  Avoid washcloths or extensive rubbing or scrubbing.  Use mild soaps like unscented Dove, Oil of Olay, Cetaphil, Basis, or CeraVe.  If you take baths rather than showers, rinse off soap residue with clean water before getting out of tub.  Once out of the shower/tub, pat dry gently with a soft towel. Leave your skin damp.  While still damp, apply any medicated ointment/cream you were prescribed to the affected areas. After you apply your medicated ointment/cream, then apply your moisturizer to your whole body.This is the most important step in dry skin care. If this is omitted, your skin will continue to be  dry.  The choice of moisturizer is also very important. In general, lotion will not provider enough moisture to severely dry skin because it is water based. You should use an ointment or cream. Moisturizers should also be unscented. Good choices include Vaseline (plain petrolatum), Aquaphor, Cetaphil, CeraVe, Vanicream, DML Forte, Aveeno moisture, or Eucerin Cream.  Bath oils can be helpful, but do not replace the application of moisturizer after the bath. In addition, they make the tub slippery causing an increased risk for falls. Therefore, we do not recommend their use.   Rosacea  What is rosacea? Rosacea (say: ro-zay-sha) is a common skin disease that usually begins as a trend of flushing or blushing easily.  As rosacea progresses, a persistent redness in the center of the face will develop and may gradually spread beyond the nose and cheeks to the forehead and chin.  In some cases, the ears, chest, and back could be affected.  Rosacea may appear as tiny blood vessels or small red bumps that occur in crops.  Frequently they can contain pus, and are called "pustules".  If the bumps do not contain pus, they are referred to as "papules".  Rarely, in prolonged, untreated cases of rosacea, the oil glands of the nose and cheeks may become permanently enlarged.  This is called rhinophyma, and is seen more frequently in men.  Signs and Risks In its beginning stages, rosacea tends to come and go, which makes it difficult to recognize.  It can start as intermittent flushing  of the face.  Eventually, blood vessels may become permanently visible.  Pustules and papules can appear, but can be mistaken for adult acne.  People of all races, ages, genders and ethnic groups are at risk of developing rosacea.  However, it is more common in women (especially around menopause) and adults with fair skin between the ages of 25 and 71.  Treatment Dermatologists typically recommend a combination of treatments to  effectively manage rosacea.  Treatment can improve symptoms and may stop the progression of the rosacea.  Treatment may involve both topical and oral medications.  The tetracycline antibiotics are often used for their anti-inflammatory effect; however, because of the possibility of developing antibiotic resistance, they should not be used long term at full dose.  For dilated blood vessels the options include electrodessication (uses electric current through a small needle), laser treatment, and cosmetics to hide the redness.   With all forms of treatment, improvement is a slow process, and patients may not see any results for the first 3-4 weeks.  It is very important to avoid the sun and other triggers.  Patients must wear sunscreen daily.  Skin Care Instructions: 1. Cleanse the skin with a mild soap such as CeraVe cleanser, Cetaphil cleanser, or Dove soap once or twice daily as needed. 2. Moisturize with Eucerin Redness Relief Daily Perfecting Lotion (has a subtle green tint), CeraVe Moisturizing Cream, or Oil of Olay Daily Moisturizer with sunscreen every morning and/or night as recommended. 3. Makeup should be "non-comedogenic" (won't clog pores) and be labeled "for sensitive skin". Good choices for cosmetics are: Neutrogena, Almay, and Physician's Formula.  Any product with a green tint tends to offset a red complexion. 4. If your eyes are dry and irritated, use artificial tears 2-3 times per day and cleanse the eyelids daily with baby shampoo.  Have your eyes examined at least every 2 years.  Be sure to tell your eye doctor that you have rosacea. 5. Alcoholic beverages tend to cause flushing of the skin, and may make rosacea worse. 6. Always wear sunscreen, protect your skin from extreme hot and cold temperatures, and avoid spicy foods, hot drinks, and mechanical irritation such as rubbing, scrubbing, or massaging the face.  Avoid harsh skin cleansers, cleansing masks, astringents, and exfoliation. If  a particular product burns or makes your face feel tight, then it is likely to flare your rosacea. 7. If you are having difficulty finding a sunscreen that you can tolerate, you may try switching to a chemical-free sunscreen.  These are ones whose active ingredient is zinc oxide or titanium dioxide only.  They should also be fragrance free, non-comedogenic, and labeled for sensitive skin. 8. Rosacea triggers may vary from person to person.  There are a variety of foods that have been reported to trigger rosacea.  Some patients find that keeping a diary of what they were doing when they flared helps them avoid triggers.

## 2019-07-29 ENCOUNTER — Ambulatory Visit: Payer: Medicare PPO | Admitting: Podiatry

## 2019-07-29 ENCOUNTER — Encounter: Payer: Self-pay | Admitting: Podiatry

## 2019-07-29 ENCOUNTER — Other Ambulatory Visit: Payer: Self-pay

## 2019-07-29 DIAGNOSIS — M79676 Pain in unspecified toe(s): Secondary | ICD-10-CM

## 2019-07-29 DIAGNOSIS — B351 Tinea unguium: Secondary | ICD-10-CM | POA: Diagnosis not present

## 2019-07-29 DIAGNOSIS — Q828 Other specified congenital malformations of skin: Secondary | ICD-10-CM

## 2019-07-29 NOTE — Progress Notes (Signed)
This patient returns to the office for evaluation and treatment of long thick painful nails .  This patient is unable to trim her own nails since the patient cannot reach her feet.  Patient says the nails are painful walking and wearing her shoes. She returns for preventive foot care services.  She has painful callus under ball of right foot.  General Appearance  Alert, conversant and in no acute stress.  Vascular  Dorsalis pedis and posterior tibial  pulses are palpable  bilaterally.  Capillary return is within normal limits  bilaterally. Temperature is within normal limits  bilaterally.  Neurologic  Senn-Weinstein monofilament wire test within normal limits  bilaterally. Muscle power within normal limits bilaterally.  Nails Thick disfigured discolored nails with subungual debris  from hallux to fifth toes bilaterally. No evidence of bacterial infection or drainage bilaterally.  Orthopedic  No limitations of motion  feet .  No crepitus or effusions noted.  No bony pathology or digital deformities noted.  Skin  normotropic skin  noted bilaterally.  No signs of infections or ulcers noted.   Porokeratosis sub 4 right foot.  Onychomycosis  Pain in toes right foot  Pain in toes left foot  Porokeratosis right foot.  Debridement  of nails  1-5  B/L with a nail nipper.  Nails were then filed using a dremel tool with no incidents.  Debridement of porokeratosis sub 4 right foot with # 15 blade.   RTC  4 months   Gardiner Barefoot DPM

## 2019-08-02 ENCOUNTER — Ambulatory Visit: Payer: Medicare PPO | Admitting: Podiatry

## 2019-08-19 ENCOUNTER — Ambulatory Visit: Payer: Medicare PPO | Admitting: Podiatry

## 2019-08-23 ENCOUNTER — Other Ambulatory Visit: Payer: Self-pay | Admitting: Family Medicine

## 2019-08-23 DIAGNOSIS — Z1231 Encounter for screening mammogram for malignant neoplasm of breast: Secondary | ICD-10-CM

## 2019-09-17 ENCOUNTER — Other Ambulatory Visit: Payer: Self-pay

## 2019-09-17 ENCOUNTER — Ambulatory Visit
Admission: RE | Admit: 2019-09-17 | Discharge: 2019-09-17 | Disposition: A | Discharge status: Home / Self Care | Payer: Medicare PPO | Source: Ambulatory Visit | Attending: Family Medicine | Admitting: Family Medicine

## 2019-09-17 DIAGNOSIS — Z1231 Encounter for screening mammogram for malignant neoplasm of breast: Secondary | ICD-10-CM | POA: Diagnosis not present

## 2019-11-18 ENCOUNTER — Ambulatory Visit: Payer: Medicare PPO | Admitting: Podiatry

## 2019-11-18 ENCOUNTER — Other Ambulatory Visit: Payer: Self-pay

## 2019-11-18 ENCOUNTER — Encounter: Payer: Self-pay | Admitting: Podiatry

## 2019-11-18 DIAGNOSIS — B351 Tinea unguium: Secondary | ICD-10-CM | POA: Diagnosis not present

## 2019-11-18 DIAGNOSIS — M79676 Pain in unspecified toe(s): Secondary | ICD-10-CM

## 2019-11-18 DIAGNOSIS — Q828 Other specified congenital malformations of skin: Secondary | ICD-10-CM | POA: Diagnosis not present

## 2019-11-18 NOTE — Progress Notes (Signed)
This patient returns to the office for evaluation and treatment of long thick painful nails .  This patient is unable to trim her own nails since the patient cannot reach her feet.  Patient says the nails are painful walking and wearing her shoes. She returns for preventive foot care services.  She has painful callus under ball of right foot.  General Appearance  Alert, conversant and in no acute stress.  Vascular  Dorsalis pedis and posterior tibial  pulses are palpable  bilaterally.  Capillary return is within normal limits  bilaterally. Temperature is within normal limits  bilaterally.  Neurologic  Senn-Weinstein monofilament wire test within normal limits  bilaterally. Muscle power within normal limits bilaterally.  Nails Thick disfigured discolored nails with subungual debris  from hallux to fifth toes bilaterally. No evidence of bacterial infection or drainage bilaterally.  Orthopedic  No limitations of motion  feet .  No crepitus or effusions noted.  No bony pathology or digital deformities noted.  HAV  Right foot.  Skin  normotropic skin  noted bilaterally.  No signs of infections or ulcers noted.   Porokeratosis sub 3 right foot.  Onychomycosis  Pain in toes right foot  Pain in toes left foot  Porokeratosis right foot.  Debridement  of nails  1-5  B/L with a nail nipper.  Nails were then filed using a dremel tool with no incidents.  Debridement of porokeratosis sub 3 right foot with # 15 blade.   RTC  4 months   Mathias Bogacki DPM  

## 2019-11-29 ENCOUNTER — Ambulatory Visit: Payer: Medicare PPO | Admitting: Podiatry

## 2020-03-20 ENCOUNTER — Other Ambulatory Visit: Payer: Self-pay

## 2020-03-20 ENCOUNTER — Ambulatory Visit: Payer: Medicare PPO | Admitting: Podiatry

## 2020-03-20 ENCOUNTER — Encounter: Payer: Self-pay | Admitting: Podiatry

## 2020-03-20 DIAGNOSIS — B351 Tinea unguium: Secondary | ICD-10-CM

## 2020-03-20 DIAGNOSIS — Q828 Other specified congenital malformations of skin: Secondary | ICD-10-CM

## 2020-03-20 DIAGNOSIS — M79676 Pain in unspecified toe(s): Secondary | ICD-10-CM | POA: Diagnosis not present

## 2020-03-20 NOTE — Progress Notes (Signed)
This patient returns to the office for evaluation and treatment of long thick painful nails .  This patient is unable to trim her own nails since the patient cannot reach her feet.  Patient says the nails are painful walking and wearing her shoes. She returns for preventive foot care services.  She has painful callus under ball of right foot.  General Appearance  Alert, conversant and in no acute stress.  Vascular  Dorsalis pedis and posterior tibial  pulses are palpable  bilaterally.  Capillary return is within normal limits  bilaterally. Temperature is within normal limits  bilaterally.  Neurologic  Senn-Weinstein monofilament wire test within normal limits  bilaterally. Muscle power within normal limits bilaterally.  Nails Thick disfigured discolored nails with subungual debris  from hallux to fifth toes bilaterally. No evidence of bacterial infection or drainage bilaterally.  Orthopedic  No limitations of motion  feet .  No crepitus or effusions noted.  No bony pathology or digital deformities noted.  HAV  Right foot.  Skin  normotropic skin  noted bilaterally.  No signs of infections or ulcers noted.   Porokeratosis sub 3 right foot.  Onychomycosis  Pain in toes right foot  Pain in toes left foot  Porokeratosis right foot.  Debridement  of nails  1-5  B/L with a nail nipper.  Nails were then filed using a dremel tool with no incidents.  Debridement of porokeratosis sub 3 right foot with # 15 blade.   RTC  4 months   Gardiner Barefoot DPM

## 2020-06-14 ENCOUNTER — Ambulatory Visit: Payer: Medicare PPO | Admitting: Podiatry

## 2020-06-15 ENCOUNTER — Ambulatory Visit: Payer: Medicare PPO | Admitting: Podiatry

## 2020-06-15 ENCOUNTER — Encounter: Payer: Self-pay | Admitting: Podiatry

## 2020-06-15 ENCOUNTER — Other Ambulatory Visit: Payer: Self-pay

## 2020-06-15 DIAGNOSIS — B351 Tinea unguium: Secondary | ICD-10-CM | POA: Diagnosis not present

## 2020-06-15 DIAGNOSIS — M79676 Pain in unspecified toe(s): Secondary | ICD-10-CM | POA: Diagnosis not present

## 2020-06-15 NOTE — Progress Notes (Signed)
This patient returns to the office for evaluation and treatment of long thick painful nails .  This patient is unable to trim her own nails since the patient cannot reach her feet.  Patient says the nails are painful walking and wearing her shoes. She returns for preventive foot care services.  She has painful callus under ball of right foot.  General Appearance  Alert, conversant and in no acute stress.  Vascular  Dorsalis pedis and posterior tibial  pulses are palpable  bilaterally.  Capillary return is within normal limits  bilaterally. Temperature is within normal limits  bilaterally.  Neurologic  Senn-Weinstein monofilament wire test within normal limits  bilaterally. Muscle power within normal limits bilaterally.  Nails Thick disfigured discolored nails with subungual debris  from hallux to fifth toes bilaterally. No evidence of bacterial infection or drainage bilaterally.  Orthopedic  No limitations of motion  feet .  No crepitus or effusions noted.  No bony pathology or digital deformities noted.  HAV  Right foot.  Skin  normotropic skin  noted bilaterally.  No signs of infections or ulcers noted.   Porokeratosis sub 3 right foot.  Onychomycosis  Pain in toes right foot  Pain in toes left foot  Porokeratosis right foot.  Debridement  of nails  1-5  B/L with a nail nipper.  Nails were then filed using a dremel tool with no incidents.  Debridement of porokeratosis sub 3 right foot with # 15 blade.   RTC  4 months   Boneta Lucks D.P.M.

## 2020-06-28 ENCOUNTER — Telehealth: Payer: Self-pay | Admitting: Obstetrics and Gynecology

## 2020-06-28 NOTE — Telephone Encounter (Signed)
Patient was seen a few years ago for a situation that she wished to not discuss with me.  She states she has started having the symptoms again.  She stated that the cream was Clobetasol she applied twice daily to affected area.

## 2020-06-29 NOTE — Telephone Encounter (Signed)
Pt called no answer LM via VM that I was calling to touch base concerning her call to the office and to encourage her to keep her appointment with [facility] tomorrow.

## 2020-06-30 ENCOUNTER — Ambulatory Visit (INDEPENDENT_AMBULATORY_CARE_PROVIDER_SITE_OTHER): Payer: Medicare PPO | Admitting: Obstetrics and Gynecology

## 2020-06-30 ENCOUNTER — Encounter: Payer: Self-pay | Admitting: Obstetrics and Gynecology

## 2020-06-30 ENCOUNTER — Other Ambulatory Visit: Payer: Self-pay

## 2020-06-30 VITALS — BP 149/90 | HR 89 | Ht <= 58 in | Wt 127.7 lb

## 2020-06-30 DIAGNOSIS — L900 Lichen sclerosus et atrophicus: Secondary | ICD-10-CM

## 2020-06-30 DIAGNOSIS — N952 Postmenopausal atrophic vaginitis: Secondary | ICD-10-CM

## 2020-06-30 DIAGNOSIS — N362 Urethral caruncle: Secondary | ICD-10-CM | POA: Diagnosis not present

## 2020-06-30 NOTE — Progress Notes (Signed)
Pt present due to burning in private area. Pt stated having the burning for about 6 weeks.

## 2020-06-30 NOTE — Patient Instructions (Addendum)
Vaginal atrophy: Premarin Cream - use dime-sized amount once a night for total 4 weeks. Place at urethra (where you urinate from).    Lichen sclerosis: Clobetasol ointment - Use once daily for 4 weeks, then decrease to every other day for 4 weeks, then decrease to twice weekly for 4 weeks, then as needed.

## 2020-06-30 NOTE — Progress Notes (Signed)
    GYNECOLOGY PROGRESS NOTE  Subjective:    Patient ID: Kelli [name], female    DOB: February 28, 1934, 86 y.o.   MRN: [id number]  HPI  Patient is a 86 y.o. G45P2002 female who presents for complaints of vaginal burning for ~ 6 weeks.  Was noting burning with urination, as well as sometimes with bowel movements. Thought she had a UTI, went to her PCP last week. UA was negative. Notes that they gave her a cream (Clobetasol) as they thought it was a recurrence of her lichen sclerosis.  Patient notes she had not had issues with this in almost 3 years.   The following portions of the patient's history were reviewed and updated as appropriate: allergies, current medications, past family history, past medical history, past social history, past surgical history, and problem list.  Review of Systems Pertinent items noted in HPI and remainder of comprehensive ROS otherwise negative.   Objective:   Blood pressure (!) 149/90, pulse 89, height 4\' 10"  (1.473 m), weight 127 lb 11.2 oz (57.9 kg). General appearance: alert and no distress Abdomen: soft, non-tender; bowel sounds normal; no masses,  no organomegaly Pelvic: external genitalia with moderate lichenification of the vulva bilaterally extending to the rectum, Urethral caruncle present.  Moderate vaginal atrophy without discharge. Bimanual exam not performed.      Labs:  Results for orders placed or performed in visit on 02/17/18  Urine Culture   Specimen: Urine   UR  Result Value Ref Range   Urine Culture, Routine Final report    Organism ID, Bacteria Comment   POCT urinalysis dipstick  Result Value Ref Range   Color, UA light yellow    Clarity, UA clear    Glucose, UA Negative Negative   Bilirubin, UA neg    Ketones, UA neg    Spec Grav, UA <=1.005 (A) 1.010 - 1.025   Blood, UA moderate +2    pH, UA 8.0 5.0 - 8.0   Protein, UA Negative Negative   Urobilinogen, UA 0.2 0.2 or 1.0 E.U./dL   Nitrite, UA neg    Leukocytes, UA Moderate (2+)  (A) Negative   Appearance yellow    Odor      Assessment:   1. Lichen sclerosus   2. Urethral caruncle   3. Vaginal atrophy     Plan:  Advised to continue Clobetasol for total of 4 months. Given instructions on use.  Given samples of Premarin cream for vaginal atrophy and urethral caruncle. Advised to use nightly x 1 month. May be the cause of hematuria noted on UA.  RTC in 1 month to f/u symptoms    [name], MD [facility]

## 2020-07-28 ENCOUNTER — Other Ambulatory Visit: Payer: Self-pay

## 2020-07-28 ENCOUNTER — Encounter: Payer: Self-pay | Admitting: Obstetrics and Gynecology

## 2020-07-28 ENCOUNTER — Ambulatory Visit: Payer: Medicare PPO | Admitting: Obstetrics and Gynecology

## 2020-07-28 VITALS — BP 141/73 | HR 87 | Ht <= 58 in | Wt 129.0 lb

## 2020-07-28 DIAGNOSIS — N952 Postmenopausal atrophic vaginitis: Secondary | ICD-10-CM | POA: Diagnosis not present

## 2020-07-28 DIAGNOSIS — L900 Lichen sclerosus et atrophicus: Secondary | ICD-10-CM

## 2020-07-28 NOTE — Progress Notes (Signed)
    GYNECOLOGY PROGRESS NOTE  Subjective:    Patient ID: Kelli [name], female    DOB: 1934-04-08, 86 y.o.   MRN: [id number]  HPI  Patient is a 86 y.o. G62P2002 female who presents for 1 month f/u of lichen sclerosis and vaginal atrophy.  Still notes occasionally urethral irritation or blood with wiping but overall symptoms are much better. Was ruled out for UTI last visit. Has been using Premarin cream as prescribed. Notes she was not sure if she was supposed to use the Clobetasol ointment with it so has not been using.   The following portions of the patient's history were reviewed and updated as appropriate: allergies, current medications, past family history, past medical history, past social history, past surgical history, and problem list.  Review of Systems Pertinent items noted in HPI and remainder of comprehensive ROS otherwise negative.   Objective:   Blood pressure (!) 141/73, pulse 87, height 4\' 10"  (1.473 m), weight 129 lb (58.5 kg). General appearance: alert and no distress Abdomen: soft, non-tender; bowel sounds normal; no masses,  no organomegaly Pelvic: external genitalia with moderate lichenification of the vulva bilaterally extending to the rectum, Urethral caruncle present.  Mild to moderate vaginal atrophy without discharge. Bimanual exam not performed.      Assessment:   1. Lichen sclerosus   2. Vaginal atrophy     Plan:  Advised to initiate Clobetasol and continue use for total of 4 months. Given instructions on use, printed AVS.  Continue Premarin cream for vaginal atrophy and urethral caruncle. Can cut down use to 2-3 times per week. If persistent bleeding still noted after next visit, may benefit from f/u with Urology regarding caruncle to r/o malignancy.   RTC in 4-6 weeks to f/u symptoms    A total of 15 minutes were spent face-to-face with the patient during this encounter and over half of that time dealt with counseling and coordination of  care.   [name], MD [facility]

## 2020-07-28 NOTE — Progress Notes (Signed)
Pt present for follow up for lichen sclerosis. Pt stated that she was still having mild symptoms.

## 2020-07-28 NOTE — Patient Instructions (Signed)
CLOBETASOL OINTMENT (0.05%)  Apply to affected area every night for 4 weeks, then every other day for 4 weeks and then twice a week for 4 weeks or until resolution.    PREMARIN CREAM Apply small amount internally to urethra 2-3 times per week at bedtime.

## 2020-07-31 ENCOUNTER — Ambulatory Visit: Payer: Medicare PPO | Admitting: Podiatry

## 2020-08-03 ENCOUNTER — Ambulatory Visit: Payer: Medicare PPO | Admitting: Podiatry

## 2020-09-08 ENCOUNTER — Other Ambulatory Visit: Payer: Self-pay

## 2020-09-08 ENCOUNTER — Encounter: Payer: Self-pay | Admitting: Obstetrics and Gynecology

## 2020-09-08 ENCOUNTER — Ambulatory Visit: Payer: Medicare PPO | Admitting: Obstetrics and Gynecology

## 2020-09-08 VITALS — BP 149/83 | HR 85 | Resp 16 | Ht <= 58 in | Wt 127.1 lb

## 2020-09-08 DIAGNOSIS — N952 Postmenopausal atrophic vaginitis: Secondary | ICD-10-CM

## 2020-09-08 DIAGNOSIS — L900 Lichen sclerosus et atrophicus: Secondary | ICD-10-CM | POA: Diagnosis not present

## 2020-09-08 NOTE — Patient Instructions (Signed)
Lichen Sclerosus Lichen sclerosus is a skin problem. It can happen on any part of the body. It happens most often in the areas around the anus or the genitals. It can cause itching and discomfort. Treatment can help to control symptoms. It can alsohelp prevent scarring that may lead to other problems. What are the causes? The cause of this condition is not known. It is not passed from one person toanother. What increases the risk? Being a woman who has reached menopause. Being a man who was not circumcised. Being a child who is about to reach puberty. What are the signs or symptoms? Symptoms of this condition include: Thin, wrinkled, white areas on the skin. Thickened white areas on the skin. Red and swollen patches on the skin. Tears or cracks in the skin. Bruising. Blood blisters. Very bad itching. Pain, itching, or burning when peeing (urinating). Children are also most likely to have trouble pooping (constipation). Some adults too can have trouble pooping. How is this treated? This condition may be treated with: Creams or ointments (topical steroids) that are put on the skin in the affected areas. This is the most common treatment. Medicines that are taken by mouth. Topical immunotherapy. This is putting creams and ointments on the affected area. The creams and ointments will make your immunity strong in order to fight the infection. This is needed if steroids have not helped. Surgery. This is only needed if the condition is very bad and is causing problems such as scarring. Follow these instructions at home: Medicines Take over-the-counter and prescription medicines only as told by your health care provider. Use creams or ointments as told by your doctor. Skin care Do not scratch the affected areas of skin. If you are a woman, keep the vagina as clean and dry as you can. Clean the affected area of skin gently with water only. Avoid using rough towels or toilet paper. Avoid  products that irritate the skin. These include soap and scented lotions. Your doctor will tell you what creams to use to treat itching. General instructions Keep all follow-up visits. You may need to take these actions to prevent or treat trouble pooping: Drink enough fluid to keep your pee (urine) pale yellow. Take over-the-counter or prescription medicines. Eat foods that are high in fiber. These include beans, whole grains, and fresh fruits and vegetables. Limit foods that are high in fat and sugar. These include fried or sweet foods. Contact a doctor if: Your redness, swelling, or pain gets worse. You have fluid, blood, or pus coming from the area. You have new red and swollen patches on your skin. You have a fever. You have pain during sex. Get help right away if: You have very bad pain or burning in the affected areas, especially in the area around your vagina, penis, or anus. Summary Lichen sclerosus is a skin problem. It can cause itching and discomfort. This condition is usually treated with creams or ointments that are put on the skin in the affected areas. Use medicines only as told by your doctor. Do not scratch the affected areas of skin. Keep all follow-up visits. This information is not intended to replace advice given to you by your health care provider. Make sure you discuss any questions you have with your healthcare provider. Document Revised: 05/22/2019 Document Reviewed: 05/22/2019 Elsevier Patient Education  [place].

## 2020-09-08 NOTE — Progress Notes (Signed)
    GYNECOLOGY PROGRESS NOTE  Subjective:    Patient ID: Kelli [name], female    DOB: 1934-05-21, 86 y.o.   MRN: [id number] HPI Patient is a 86 y.o. G57P2002 female who presents for 6 week f/u of lichen sclerosis and vaginal atrophy. She has been using the Premarin as prescribed and she uses Clobetasol ointment every other day. She thinks it is helping but can't tell for sure. She continues to have burning with urination. She also notices some small spotting that comes and goes. She feels like she has these symptoms occur when she is active. She plays ping-pong and dances several days during the week.   The following portions of the patient's history were reviewed and updated as appropriate: allergies, current medications, past family history, past medical history, past social history, past surgical history, and problem list.  Review of Systems Pertinent items noted in HPI and remainder of comprehensive ROS otherwise negative.   Objective:   Blood pressure (!) 149/83, pulse 85, resp. rate 16, height 4' 9.5" (1.461 m), weight 127 lb 1.6 oz (57.7 kg). Body mass index is 27.03 kg/m. General appearance: alert and no distress Abdomen: soft, non-tender; bowel sounds normal; no masses,  no organomegaly Pelvic: external genitalia with moderate lichenification of the vulva bilaterally extending to the rectum (slightly improved from last visit), Urethral caruncle present.  Mild to moderate vaginal atrophy without discharge. Bimanual exam not performed.     Assessment:   1. Lichen sclerosus   2. Vaginal atrophy     Plan:   - Continue Premarin cream for vaginal atrophy and Clobetasol for lichen sclerosis.  Patient to f/u in 3 months to reassess.    [name], MD [facility].

## 2020-09-18 ENCOUNTER — Ambulatory Visit: Payer: Medicare PPO | Admitting: Podiatry

## 2020-09-18 ENCOUNTER — Encounter: Payer: Self-pay | Admitting: Podiatry

## 2020-09-18 ENCOUNTER — Other Ambulatory Visit: Payer: Self-pay

## 2020-09-18 DIAGNOSIS — N1831 Chronic kidney disease, stage 3a: Secondary | ICD-10-CM

## 2020-09-18 DIAGNOSIS — M79676 Pain in unspecified toe(s): Secondary | ICD-10-CM | POA: Diagnosis not present

## 2020-09-18 DIAGNOSIS — Q828 Other specified congenital malformations of skin: Secondary | ICD-10-CM

## 2020-09-18 DIAGNOSIS — B351 Tinea unguium: Secondary | ICD-10-CM | POA: Diagnosis not present

## 2020-09-18 NOTE — Progress Notes (Signed)
This patient returns to the office for evaluation and treatment of long thick painful nails .  This patient is unable to trim her own nails since the patient cannot reach her feet.  Patient says the nails are painful walking and wearing her shoes. She returns for preventive foot care services.  She has painful callus under ball of right foot.  General Appearance  Alert, conversant and in no acute stress.  Vascular  Dorsalis pedis and posterior tibial  pulses are palpable  bilaterally.  Capillary return is within normal limits  bilaterally. Temperature is within normal limits  bilaterally.  Neurologic  Senn-Weinstein monofilament wire test within normal limits  bilaterally. Muscle power within normal limits bilaterally.  Nails Thick disfigured discolored nails with subungual debris  from hallux to fifth toes bilaterally. No evidence of bacterial infection or drainage bilaterally.  Orthopedic  No limitations of motion  feet .  No crepitus or effusions noted.  No bony pathology or digital deformities noted.  HAV  Right foot.  Skin  normotropic skin  noted bilaterally.  No signs of infections or ulcers noted.   Porokeratosis sub 3 right foot.  Onychomycosis  Pain in toes right foot  Pain in toes left foot  Porokeratosis right foot.  Debridement  of nails  1-5  B/L with a nail nipper.  Nails were then filed using a dremel tool with no incidents.  Debridement of porokeratosis sub 3 right foot with # 15 blade.   RTC  37month   GGardiner BarefootDPM

## 2020-12-07 ENCOUNTER — Encounter: Payer: Self-pay | Admitting: Obstetrics and Gynecology

## 2020-12-07 ENCOUNTER — Other Ambulatory Visit: Payer: Self-pay

## 2020-12-07 ENCOUNTER — Encounter: Payer: Medicare PPO | Admitting: Obstetrics and Gynecology

## 2020-12-07 ENCOUNTER — Ambulatory Visit: Payer: Medicare PPO | Admitting: Obstetrics and Gynecology

## 2020-12-07 VITALS — BP 121/60 | HR 87 | Ht <= 58 in | Wt 126.3 lb

## 2020-12-07 DIAGNOSIS — L900 Lichen sclerosus et atrophicus: Secondary | ICD-10-CM | POA: Diagnosis not present

## 2020-12-07 DIAGNOSIS — N952 Postmenopausal atrophic vaginitis: Secondary | ICD-10-CM

## 2020-12-07 NOTE — Progress Notes (Signed)
    GYNECOLOGY PROGRESS NOTE  Subjective:    Patient ID: Kelli [name], female    DOB: 12-06-1934, 86 y.o.   MRN: [id number] HPI Patient is a 86 y.o. G28P2002 female who presents for 3 month f/u of lichen sclerosis and vaginal atrophy. She has been using the Premarin  and Clobetasol treatments sporadically (few weeks on, few weeks off). Denies any major issues.    The following portions of the patient's history were reviewed and updated as appropriate: allergies, current medications, past family history, past medical history, past social history, past surgical history, and problem list.  Review of Systems Pertinent items noted in HPI and remainder of comprehensive ROS otherwise negative.   Objective:   BP 121/60   Pulse 87   Ht 4' 9.5" (1.461 m)   Wt 126 lb 4.8 oz (57.3 kg)   BMI 26.86 kg/m   General appearance: alert and no distress Abdomen: soft, non-tender; bowel sounds normal; no masses,  no organomegaly Pelvic: external genitalia with moderate lichenification of the vulva bilaterally extending to the rectum. Moderate vaginal atrophy without discharge. Bimanual exam not performed.     Assessment:   1. Lichen sclerosus   2. Vaginal atrophy     Plan:   - Continue Premarin cream for vaginal atrophy and Clobetasol for lichen sclerosis.  Advised to resume as originally prescribed for the next several months.  - Patient to f/u in 3 months to reassess.    [name], MD [facility].

## 2020-12-08 ENCOUNTER — Encounter: Payer: Medicare PPO | Admitting: Obstetrics and Gynecology

## 2021-02-08 ENCOUNTER — Ambulatory Visit: Payer: Medicare PPO | Admitting: Podiatry

## 2021-02-08 ENCOUNTER — Encounter: Payer: Self-pay | Admitting: Podiatry

## 2021-02-08 ENCOUNTER — Other Ambulatory Visit: Payer: Self-pay

## 2021-02-08 DIAGNOSIS — M79676 Pain in unspecified toe(s): Secondary | ICD-10-CM

## 2021-02-08 DIAGNOSIS — B351 Tinea unguium: Secondary | ICD-10-CM

## 2021-02-08 DIAGNOSIS — N1831 Chronic kidney disease, stage 3a: Secondary | ICD-10-CM

## 2021-02-08 NOTE — Progress Notes (Signed)
This patient returns to the office for evaluation and treatment of long thick painful nails .  This patient is unable to trim her own nails since the patient cannot reach her feet.  Patient says the nails are painful walking and wearing her shoes. She returns for preventive foot care services.  She has painful callus under ball of right foot.  General Appearance  Alert, conversant and in no acute stress.  Vascular  Dorsalis pedis and posterior tibial  pulses are palpable  bilaterally.  Capillary return is within normal limits  bilaterally. Temperature is within normal limits  bilaterally.  Neurologic  Senn-Weinstein monofilament wire test within normal limits  bilaterally. Muscle power within normal limits bilaterally.  Nails Thick disfigured discolored nails with subungual debris  from hallux to fifth toes bilaterally. No evidence of bacterial infection or drainage bilaterally.  Orthopedic  No limitations of motion  feet .  No crepitus or effusions noted.  No bony pathology or digital deformities noted.  HAV  Right foot.  Skin  normotropic skin  noted bilaterally.  No signs of infections or ulcers noted.   Porokeratosis sub 3 right foot.  Onychomycosis  Pain in toes right foot  Pain in toes left foot  Porokeratosis right foot.  Debridement  of nails  1-5  B/L with a nail nipper.  Nails were then filed using a dremel tool with no incidents.    RTC  4  months  Healing hematoma lateral malleous left ankle.   Gardiner Barefoot DPM

## 2021-05-04 ENCOUNTER — Emergency Department: Payer: Medicare PPO

## 2021-05-04 ENCOUNTER — Emergency Department
Admission: EM | Admit: 2021-05-04 | Discharge: 2021-05-04 | Disposition: A | Discharge status: Home / Self Care | Payer: Medicare PPO | Attending: Emergency Medicine | Admitting: Emergency Medicine

## 2021-05-04 ENCOUNTER — Other Ambulatory Visit: Payer: Self-pay

## 2021-05-04 DIAGNOSIS — S0003XA Contusion of scalp, initial encounter: Secondary | ICD-10-CM | POA: Diagnosis not present

## 2021-05-04 DIAGNOSIS — Y9389 Activity, other specified: Secondary | ICD-10-CM | POA: Insufficient documentation

## 2021-05-04 DIAGNOSIS — S59912A Unspecified injury of left forearm, initial encounter: Secondary | ICD-10-CM | POA: Diagnosis present

## 2021-05-04 DIAGNOSIS — S51812A Laceration without foreign body of left forearm, initial encounter: Secondary | ICD-10-CM | POA: Insufficient documentation

## 2021-05-04 DIAGNOSIS — W228XXA Striking against or struck by other objects, initial encounter: Secondary | ICD-10-CM | POA: Insufficient documentation

## 2021-05-04 DIAGNOSIS — S0990XA Unspecified injury of head, initial encounter: Secondary | ICD-10-CM

## 2021-05-04 MED ORDER — ACETAMINOPHEN 500 MG PO TABS
1000.0000 mg | ORAL_TABLET | Freq: Once | ORAL | Status: DC
Start: 2021-05-04 — End: 2021-05-04
  Filled 2021-05-04: qty 2

## 2021-05-04 NOTE — ED Triage Notes (Addendum)
[facility] and [facility] ball, falling and hitting back of head. No LOC, denies dizziness, CP, SOB. Pt sustained skin tear to left forearm-bleeding controlled with guaze wrap- and hematoma to back of head- skin intact. Pt is AOX4, NAD noted. Pt c/o headache and wrist pain. Pt does not take blood thinners. Pt lives at home with son. ?87 ?99% RA ?18 ?147/76 ?

## 2021-05-04 NOTE — ED Provider Notes (Signed)
?[facility] EMERGENCY DEPARTMENT ?Provider Note ? ? ?CSN: [id number] ?Arrival date & time: 05/04/21  1453 ? ?  ? ?History ? ?Chief Complaint  ?Patient presents with  ? Fall  ? ? ?Kelli [name] is a 87 y.o. female.  Presents to the emergency department for evaluation of a fall that occurred just prior to arrival.  Patient was playing ping-pong, she spun into a wall hitting the back of her head and causing a skin tear to the dorsal aspect of her left forearm.  She denies falling to the ground, no neck, back or lower extremity discomfort.  She has some mild soreness to the left wrist, no deformity.  She has a large skin tear, 7 x 2 cm to the left forearm.  Tetanus is up-to-date.  She denies any numbness tingling radicular symptoms. ? ?HPI ? ?  ? ?Home Medications ?Prior to Admission medications   ?Medication Sig Start Date End Date Taking? Authorizing Provider  ?augmented betamethasone dipropionate (DIPROLENE) 0.05 % ointment Use daily for 4 weeks, then every other day for 4 weeks, then twice daily. 03/31/18   [name], MD  ?clobetasol ointment (TEMOVATE) 0.05 % Apply topically 2 (two) times daily. 06/23/20 06/23/21  [provider]  ?conjugated estrogens (PREMARIN) vaginal cream Place 1 Applicatorful vaginally 2 (two) times a week. 02/19/18   [name], MD  ?gabapentin (NEURONTIN) 300 MG capsule Take 1 tablet by mouth daily. As needed 09/23/19   [provider]  ?Multiple Vitamin (MULTI-VITAMIN) tablet Take by mouth.    [provider]  ?predniSONE (DELTASONE) 1 MG tablet Take 4 mg by mouth daily.     [provider]  ?UNABLE TO FIND Take by mouth.    [provider]  ?   ? ?Allergies    ?Amoxicillin, Amoxicillin-pot clavulanate, Ampicillin, Cefaclor, Ciprofloxacin, Clindamycin/lincomycin, Doxycycline, Erythromycin ethylsuccinate, Hydrocodone, Imipramine hcl, Ofloxacin, Propoxyphene, Sulfa antibiotics, Tape, Terfenadine, and Tobramycin   ? ?Review of  Systems   ?Review of Systems ? ?Physical Exam ?Updated Vital Signs ?BP (!) 126/56   Pulse 67   Temp 98.1 ?F (36.7 ?C) (Oral)   Resp 17   Ht '4\' 10"'$  (1.473 m)   Wt 56.7 kg   SpO2 100%   BMI 26.13 kg/m?  ?Physical Exam ?Constitutional:   ?   Appearance: She is well-developed.  ?HENT:  ?   Head: Normocephalic and atraumatic.  ?Eyes:  ?   Conjunctiva/sclera: Conjunctivae normal.  ?Cardiovascular:  ?   Rate and Rhythm: Normal rate.  ?Pulmonary:  ?   Effort: Pulmonary effort is normal. No respiratory distress.  ?Musculoskeletal:     ?   General: Normal range of motion.  ?   Cervical back: Normal range of motion.  ?   Comments: Small palpable hematoma to the occipital region of the scalp.  No tenderness to palpation.  She has mild tenderness along the radiocarpal joint.  7 x 2 cm skin tear, very superficial to the left dorsal ulnar aspect of the forearm.  No active bleeding.  No visible or palpable foreign body.  Nontender throughout the proximal forearm or elbow  ?Skin: ?   General: Skin is warm.  ?   Findings: No rash.  ?Neurological:  ?   General: No focal deficit present.  ?   Mental Status: She is alert and oriented to person, place, and time. Mental status is at baseline.  ?Psychiatric:     ?   Behavior: Behavior normal.     ?  Thought Content: Thought content normal.  ? ? ?ED Results / Procedures / Treatments   ?Labs ?(all labs ordered are listed, but only abnormal results are displayed) ?Labs Reviewed - No data to display ? ?EKG ?None ? ?Radiology ?DG Wrist Complete Left ? ?Result Date: 05/04/2021 ?CLINICAL DATA:  Trauma. Dose for ball playing ping pong. Fell and hit back of head. Left forearm skin tear and bleeding. Pain. EXAM: LEFT WRIST - COMPLETE 3+ VIEW COMPARISON:  None. FINDINGS: Minimal ulnar positive variance. Moderate to severe triscaphe joint and moderate thumb carpometacarpal joint space narrowing. Mild peripheral thumb carpometacarpal degenerative osteophytosis. Mild joint space narrowing and  peripheral osteophytosis of the thumb interphalangeal joint. No acute fracture or dislocation. IMPRESSION:: IMPRESSION: 1. 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 2. No acute fracture is seen. Electronically Signed   By: Yvonne Kendall M.D.   On: 05/04/2021 15:26  ? ?CT Head Wo Contrast ? ?Result Date: 05/04/2021 ?CLINICAL DATA:  Head trauma, moderate-severe EXAM: CT HEAD WITHOUT CONTRAST TECHNIQUE: Contiguous axial images were obtained from the base of the skull through the vertex without intravenous contrast. RADIATION DOSE REDUCTION: This exam was performed according to the departmental dose-optimization program which includes automated exposure control, adjustment of the mA and/or kV according to patient size and/or use of iterative reconstruction technique. COMPARISON:  None. FINDINGS: Brain: No evidence of acute large vascular territory infarction, hemorrhage, hydrocephalus, extra-axial collection or mass lesion/mass effect. 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 Vascular: No hyperdense vessel identified. Calcific intracranial atherosclerosis. Skull: No acute fracture. Sinuses/Orbits: Visualized sinuses are clear. No acute orbital findings. Other: No mastoid effusions. IMPRESSION: 1. No evidence of acute intracranial abnormality. 2. 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 Electronically Signed   By: Margaretha Sheffield M.D.   On: 05/04/2021 16:07   ? ?Procedures ?Marland Kitchen.Laceration Repair ? ?Date/Time: 05/04/2021 5:40 PM ?Performed by: Duanne Guess, PA-C ?Authorized by: Duanne Guess, PA-C  ? ?Consent:  ?  Consent obtained:  Verbal ?  Consent given by:  Patient ?Laceration details:  ?  Location:  Shoulder/arm ?  Shoulder/arm location:  L lower arm ?  Length (cm):  7 ?  Depth (mm):  1 ?Pre-procedure details:  ?  Preparation:  Patient was prepped and draped in usual sterile fashion ?Treatment:   ?  Area cleansed with:  Povidone-iodine and saline ?  Irrigation method:  Pressure wash ?Skin repair:  ?  Repair method:  Tissue adhesive ?Approximation:  ?  Approximation:  Close ?Repair type:  ?  Repair type:  Simple ?Post-procedure details:  ?  Dressing:  Open (no dressing) ?  Procedure completion:  Tolerated well, no immediate complications  ? ? ?Medications Ordered in ED ?Medications  ?acetaminophen (TYLENOL) tablet 1,000 mg (has no administration in time range)  ? ? ?ED Course/ Medical Decision Making/ A&P ?  ?                        ?Medical Decision Making ?Amount and/or Complexity of Data Reviewed ?Radiology: ordered. ? ?Risk ?OTC drugs. ? ? ?87 year old female with left forearm skin tear after hitting her arm against the wall earlier today.  X-rays negative for any acute bony abnormality to the left wrist.  She also bumped the back of her head and having mild posterior occipital pain with small hematoma.  CT of the head negative for any acute intracranial process.  She is not having any vision changes, nausea vomiting.  She appears well with no neurological deficits.  Skin tear was repaired with Dermabond and she is educated on wound care.  She understands signs and symptoms return to the ER for. ?Final Clinical Impression(s) / ED Diagnoses ?Final diagnoses:  ?Injury of head, initial encounter  ?Hematoma of scalp, initial encounter  ?Skin tear of left forearm without complication, initial encounter  ? ? ?Rx / DC Orders ?ED Discharge Orders   ? ? None  ? ?  ? ? ?  ?Duanne Guess, PA-C ?05/04/21 1741 ? ?  ?Blake Divine, MD ?05/08/21 1610 ? ?

## 2021-05-04 NOTE — Discharge Instructions (Signed)
Please keep skin tear site to the left forearm clean and dry.  You may shower and get wet in 1 week and allow Dermabond to come off on its own.  Over the next week please change dressing daily.  Return to the ER for any increasing pain, swelling, warmth, redness, drainage or any headaches, nausea, vomiting or vision changes. ?

## 2021-05-24 ENCOUNTER — Ambulatory Visit: Payer: Medicare PPO | Admitting: Podiatry

## 2021-05-24 ENCOUNTER — Encounter: Payer: Self-pay | Admitting: Podiatry

## 2021-05-24 DIAGNOSIS — Q828 Other specified congenital malformations of skin: Secondary | ICD-10-CM

## 2021-05-24 DIAGNOSIS — B351 Tinea unguium: Secondary | ICD-10-CM

## 2021-05-24 DIAGNOSIS — N1831 Chronic kidney disease, stage 3a: Secondary | ICD-10-CM | POA: Diagnosis not present

## 2021-05-24 DIAGNOSIS — M79675 Pain in left toe(s): Secondary | ICD-10-CM

## 2021-05-24 DIAGNOSIS — M79674 Pain in right toe(s): Secondary | ICD-10-CM | POA: Diagnosis not present

## 2021-05-24 NOTE — Progress Notes (Signed)
This patient returns to the office for evaluation and treatment of long thick painful nails .  This patient is unable to trim her own nails since the patient cannot reach her feet.  Patient says the nails are painful walking and wearing her shoes. She returns for preventive foot care services.  She has painful callus under ball of right foot. ? ?General Appearance  Alert, conversant and in no acute stress. ? ?Vascular  Dorsalis pedis and posterior tibial  pulses are palpable  bilaterally.  Capillary return is within normal limits  bilaterally. Temperature is within normal limits  bilaterally. ? ?Neurologic  Senn-Weinstein monofilament wire test within normal limits  bilaterally. Muscle power within normal limits bilaterally. ? ?Nails Thick disfigured discolored nails with subungual debris  from hallux to fifth toes bilaterally. No evidence of bacterial infection or drainage bilaterally. ? ?Orthopedic  No limitations of motion  feet .  No crepitus or effusions noted.  No bony pathology or digital deformities noted.  HAV  Right foot. ? ?Skin  normotropic skin  noted bilaterally.  No signs of infections or ulcers noted.   Porokeratosis sub 3 right foot. ? ?Onychomycosis  Pain in toes right foot  Pain in toes left foot  Porokeratosis right foot. ? ?Debridement  of nails  1-5  B/L with a nail nipper.  Nails were then filed using a dremel tool with no incidents.    RTC  4  months   ? ? ?Gardiner Barefoot DPM  ?

## 2021-07-13 ENCOUNTER — Other Ambulatory Visit: Payer: Self-pay

## 2021-07-13 ENCOUNTER — Emergency Department: Payer: Medicare PPO

## 2021-07-13 ENCOUNTER — Emergency Department
Admission: EM | Admit: 2021-07-13 | Discharge: 2021-07-13 | Disposition: A | Discharge status: Home / Self Care | Payer: Medicare PPO | Attending: Student in an Organized Health Care Education/Training Program | Admitting: Student in an Organized Health Care Education/Training Program

## 2021-07-13 DIAGNOSIS — G44319 Acute post-traumatic headache, not intractable: Secondary | ICD-10-CM

## 2021-07-13 DIAGNOSIS — Y92410 Unspecified street and highway as the place of occurrence of the external cause: Secondary | ICD-10-CM | POA: Diagnosis not present

## 2021-07-13 DIAGNOSIS — S39012A Strain of muscle, fascia and tendon of lower back, initial encounter: Secondary | ICD-10-CM | POA: Diagnosis not present

## 2021-07-13 DIAGNOSIS — S3992XA Unspecified injury of lower back, initial encounter: Secondary | ICD-10-CM | POA: Diagnosis present

## 2021-07-13 DIAGNOSIS — S7002XA Contusion of left hip, initial encounter: Secondary | ICD-10-CM | POA: Diagnosis not present

## 2021-07-13 MED ORDER — PREDNISONE 50 MG PO TABS: 50.0000 mg | ORAL_TABLET | Freq: Every day | ORAL | 0 refills | Status: DC

## 2021-07-13 MED ORDER — METHOCARBAMOL 500 MG PO TABS: 500.0000 mg | ORAL_TABLET | Freq: Four times a day (QID) | ORAL | 0 refills | Status: DC

## 2021-08-20 ENCOUNTER — Encounter: Payer: Self-pay | Admitting: Podiatry

## 2021-08-20 ENCOUNTER — Ambulatory Visit: Payer: Medicare PPO | Admitting: Podiatry

## 2021-08-20 DIAGNOSIS — B351 Tinea unguium: Secondary | ICD-10-CM | POA: Diagnosis not present

## 2021-08-20 DIAGNOSIS — N1831 Chronic kidney disease, stage 3a: Secondary | ICD-10-CM | POA: Diagnosis not present

## 2021-08-20 DIAGNOSIS — M79676 Pain in unspecified toe(s): Secondary | ICD-10-CM | POA: Diagnosis not present

## 2021-08-20 DIAGNOSIS — Q828 Other specified congenital malformations of skin: Secondary | ICD-10-CM | POA: Diagnosis not present

## 2021-08-20 NOTE — Progress Notes (Signed)
This patient returns to the office for evaluation and treatment of long thick painful nails .  This patient is unable to trim her own nails since the patient cannot reach her feet.  Patient says the nails are painful walking and wearing her shoes. She returns for preventive foot care services.  She has painful callus under ball of right foot.  General Appearance  Alert, conversant and in no acute stress.  Vascular  Dorsalis pedis and posterior tibial  pulses are palpable  bilaterally.  Capillary return is within normal limits  bilaterally. Temperature is within normal limits  bilaterally.  Neurologic  Senn-Weinstein monofilament wire test within normal limits  bilaterally. Muscle power within normal limits bilaterally.  Nails Thick disfigured discolored nails with subungual debris  from hallux to fifth toes bilaterally. No evidence of bacterial infection or drainage bilaterally.  Orthopedic  No limitations of motion  feet .  No crepitus or effusions noted.  No bony pathology or digital deformities noted.  HAV  Right foot.  Skin  normotropic skin  noted bilaterally.  No signs of infections or ulcers noted.   Porokeratosis sub 3 right foot.  Onychomycosis  Pain in toes right foot  Pain in toes left foot  Porokeratosis right foot.  Debridement  of nails  1-5  B/L with a nail nipper.  Nails were then filed using a dremel tool with no incidents.    RTC  3  months     Berlin Mokry DPM  

## 2021-08-29 ENCOUNTER — Telehealth: Payer: Self-pay | Admitting: Obstetrics and Gynecology

## 2021-08-29 ENCOUNTER — Encounter: Payer: Self-pay | Admitting: Obstetrics and Gynecology

## 2021-08-29 ENCOUNTER — Ambulatory Visit: Payer: Medicare PPO | Admitting: Obstetrics and Gynecology

## 2021-08-29 VITALS — BP 131/68 | HR 79 | Resp 16 | Ht <= 58 in | Wt 127.2 lb

## 2021-08-29 DIAGNOSIS — N952 Postmenopausal atrophic vaginitis: Secondary | ICD-10-CM

## 2021-08-29 DIAGNOSIS — L900 Lichen sclerosus et atrophicus: Secondary | ICD-10-CM

## 2021-08-29 NOTE — Telephone Encounter (Signed)
Patient called and states the medications she informed the provider that she has at home, she does not. She is asking if a RX can be sent in for premarin cream and Clobetasol Propionate ointment 0.05% to her pharmacy. Verified with patient pharmacy being [facility] on [address]. Please advise.

## 2021-08-29 NOTE — Progress Notes (Signed)
    GYNECOLOGY PROGRESS NOTE  Subjective:    Patient ID: Kelli [name], female    DOB: 01/20/1935, 87 y.o.   MRN: [id number]  HPI  Patient is a 87 y.o. G30P2002 female who presents for vaginal itching (external) and burning x 2 weeks, associated with urination. She was evaluated by her PCP, and was diagnosed with UTI and treated with Cephalexin,. Paitent reports that the symptoms  did not resolve with treatment.  She then thought it may be due to her lichen sclerosis and so initiated her Clobetasol yesterday.   The following portions of the patient's history were reviewed and updated as appropriate: allergies, current medications, past family history, past medical history, past social history, past surgical history, and problem list.  Review of Systems Pertinent items are noted in HPI.   Objective:   Blood pressure 131/68, pulse 79, resp. rate 16, height '4\' 9"'$  (1.448 m), weight 127 lb 3.2 oz (57.7 kg).  Body mass index is 27.53 kg/m. General appearance: alert, cooperative, and no distress Abdomen: soft, non-tender; bowel sounds normal; no masses,  no organomegaly Pelvic: external genitalia with mild lichenification of the vulva bilaterally. Mild atrophy of the vagina, no discharge. Bimanual exam not performed.  Extremities: extremities normal, atraumatic, no cyanosis or edema Neurologic: Grossly normal   Assessment:   1. Lichen sclerosus   2. Vaginal atrophy     Plan:   Lichen sclerosis - patient recently resumed Clobetasol. Advised to continue use daily for up to 4 weeks, then can taper down use. Vaginal atrophy - once patient is finished with Clobetasol, can resume Premarin cream twice weekly.    [name], MD [facility]

## 2021-08-30 MED ORDER — CLOBETASOL PROP EMOLLIENT BASE 0.05 % EX CREA: 1.0000 | TOPICAL_CREAM | Freq: Two times a day (BID) | CUTANEOUS | 3 refills | Status: DC

## 2021-08-30 MED ORDER — PREMARIN 0.625 MG/GM VA CREA: 1.0000 | TOPICAL_CREAM | Freq: Every day | VAGINAL | 3 refills | Status: DC

## 2021-08-30 NOTE — Telephone Encounter (Signed)
Called Tanya to inform her that medication was sent in today. She verbalized understanding and will pick up medication tomorrow.

## 2021-08-30 NOTE — Addendum Note (Signed)
Addended by: Chilton Greathouse on: 08/30/2021 03:56 PM   Modules accepted: Orders

## 2021-08-30 NOTE — Telephone Encounter (Signed)
Patient's Kelli [name] listed on DPR is calling to follow up on prescriptions to be sent in. Please advise Kelli [name] would like to be notified when they are sent in. Please advise? [phone]

## 2021-10-11 ENCOUNTER — Ambulatory Visit: Payer: Medicare PPO | Admitting: Podiatry

## 2021-11-22 ENCOUNTER — Ambulatory Visit: Payer: Medicare PPO | Admitting: Podiatry

## 2021-12-20 ENCOUNTER — Encounter: Payer: Self-pay | Admitting: Podiatry

## 2021-12-20 ENCOUNTER — Ambulatory Visit: Payer: Medicare PPO | Admitting: Podiatry

## 2021-12-20 DIAGNOSIS — M79676 Pain in unspecified toe(s): Secondary | ICD-10-CM | POA: Diagnosis not present

## 2021-12-20 DIAGNOSIS — B351 Tinea unguium: Secondary | ICD-10-CM | POA: Diagnosis not present

## 2021-12-20 DIAGNOSIS — Q828 Other specified congenital malformations of skin: Secondary | ICD-10-CM

## 2021-12-20 DIAGNOSIS — N1831 Chronic kidney disease, stage 3a: Secondary | ICD-10-CM | POA: Diagnosis not present

## 2021-12-20 NOTE — Progress Notes (Signed)
This patient returns to the office for evaluation and treatment of long thick painful nails .  This patient is unable to trim her own nails since the patient cannot reach her feet.  Patient says the nails are painful walking and wearing her shoes. She returns for preventive foot care services.  She has painful callus under ball of right foot.  General Appearance  Alert, conversant and in no acute stress.  Vascular  Dorsalis pedis and posterior tibial  pulses are palpable  bilaterally.  Capillary return is within normal limits  bilaterally. Temperature is within normal limits  bilaterally.  Neurologic  Senn-Weinstein monofilament wire test within normal limits  bilaterally. Muscle power within normal limits bilaterally.  Nails Thick disfigured discolored nails with subungual debris  from hallux to fifth toes bilaterally. No evidence of bacterial infection or drainage bilaterally.  Orthopedic  No limitations of motion  feet .  No crepitus or effusions noted.  No bony pathology or digital deformities noted.  HAV  Right foot.  Skin  normotropic skin  noted bilaterally.  No signs of infections or ulcers noted.   Porokeratosis sub 3 right foot.  Onychomycosis  Pain in toes right foot  Pain in toes left foot  Porokeratosis right foot.  Debridement  of nails  1-5  B/L with a nail nipper.  Nails were then filed using a dremel tool with no incidents.    RTC  3  months     Gardiner Barefoot DPM

## 2021-12-23 ENCOUNTER — Other Ambulatory Visit: Payer: Self-pay | Admitting: Obstetrics and Gynecology

## 2022-02-04 ENCOUNTER — Telehealth: Payer: Self-pay

## 2022-02-04 NOTE — Telephone Encounter (Signed)
Returned patients call. Patient is aware that based on her insurance card provided to [place] on 08/29/2021 her copay for a specialist is $40.00. This is still in effect as long as no changes were made in her insurance for year 2024. Patient verbalized understanding. Patient does not desire an appointment at this time.

## 2022-02-04 NOTE — Telephone Encounter (Signed)
Patient has questions regarding her insurance, how much would she pay if she were to come see Dr. Marcelline Mates. She is requesting a call back please.

## 2022-04-11 ENCOUNTER — Encounter: Payer: Self-pay | Admitting: Podiatry

## 2022-04-29 ENCOUNTER — Ambulatory Visit: Payer: Medicare PPO | Admitting: Podiatry

## 2022-06-03 ENCOUNTER — Ambulatory Visit: Payer: Medicare PPO | Admitting: Podiatry

## 2022-06-03 ENCOUNTER — Encounter: Payer: Self-pay | Admitting: Podiatry

## 2022-06-03 DIAGNOSIS — M79676 Pain in unspecified toe(s): Secondary | ICD-10-CM | POA: Diagnosis not present

## 2022-06-03 DIAGNOSIS — M79671 Pain in right foot: Secondary | ICD-10-CM

## 2022-06-03 DIAGNOSIS — B351 Tinea unguium: Secondary | ICD-10-CM

## 2022-06-03 DIAGNOSIS — Q828 Other specified congenital malformations of skin: Secondary | ICD-10-CM | POA: Diagnosis not present

## 2022-06-03 NOTE — Progress Notes (Unsigned)
  Subjective:  Patient ID: Kelli [name], female    DOB: [birth year],  MRN: [id number]  Kelli [name] presents to clinic today for {jgcomplaint:23593} No chief complaint on file.  New problem(s): None. {jgcomplaint:23593}  PCP is Kandyce Rud, MD.  Allergies  Allergen Reactions   Amoxicillin Rash and Other (See Comments)    Has patient had a PCN reaction causing immediate rash, facial/tongue/throat swelling, SOB or lightheadedness with hypotension: Yes Has patient had a PCN reaction causing severe rash involving mucus membranes or skin necrosis: No Has patient had a PCN reaction that required hospitalization: No Has patient had a PCN reaction occurring within the last 10 years: No If all of the above answers are "NO", then may proceed with Cephalosporin use.   Amoxicillin-Pot Clavulanate Rash   Ampicillin Rash   Cefaclor Rash   Ciprofloxacin Rash   Clindamycin/Lincomycin Diarrhea and Rash   Doxycycline Rash   Erythromycin Ethylsuccinate Rash   Hydrocodone Nausea And Vomiting    Very sensitive to it.Very sleepy   Imipramine Hcl Rash   Ofloxacin Rash   Propoxyphene Rash   Sulfa Antibiotics Rash   Tape Rash and Other (See Comments)    adhesive   Terfenadine Rash   Tobramycin Rash    Review of Systems: Negative except as noted in the HPI.  Objective: No changes noted in today's physical examination. There were no vitals filed for this visit. Kelli [name] is a pleasant 88 y.o. female {jgbodyhabitus:24098} AAO x 3.  Vascular Examination: Capillary refill time immediate b/l. Vascular status intact b/l with palpable pedal pulses. Pedal hair present b/l. No edema. No pain with calf compression b/l. Skin temperature gradient WNL b/l. {jgvascular:23595}  Neurological Examination: Sensation grossly intact b/l with 10 gram monofilament. Vibratory sensation intact b/l. {jgneuro:23601::"Protective sensation intact 5/5 intact bilaterally with 10g monofilament b/l.","Vibratory  sensation intact b/l.","Proprioception intact bilaterally."}  Dermatological Examination: Pedal skin with normal turgor, texture and tone b/l.  No open wounds. No interdigital macerations.   Toenails 1-5 b/l thick, discolored, elongated with subungual debris and pain on dorsal palpation.   {jgderm:23598}  Musculoskeletal Examination: {jgmsk:23600}  Radiographs: None  {jgxrayfindings:23683}  Last A1c:       No data to display         Assessment/Plan: No diagnosis found.  No orders of the defined types were placed in this encounter.   None {Jgplan:23602::"-Patient/POA to call should there be question/concern in the interim."}   Return in about 3 months (around 09/03/2022).  Kelli [name], DPM

## 2022-06-05 ENCOUNTER — Encounter: Payer: Self-pay | Admitting: Podiatry

## 2022-10-07 ENCOUNTER — Encounter: Payer: Self-pay | Admitting: Podiatry

## 2022-10-07 ENCOUNTER — Ambulatory Visit (INDEPENDENT_AMBULATORY_CARE_PROVIDER_SITE_OTHER): Payer: Medicare PPO | Admitting: Podiatry

## 2022-10-07 DIAGNOSIS — M79676 Pain in unspecified toe(s): Secondary | ICD-10-CM

## 2022-10-07 DIAGNOSIS — M79671 Pain in right foot: Secondary | ICD-10-CM

## 2022-10-07 DIAGNOSIS — B351 Tinea unguium: Secondary | ICD-10-CM | POA: Diagnosis not present

## 2022-10-07 DIAGNOSIS — Q828 Other specified congenital malformations of skin: Secondary | ICD-10-CM

## 2022-10-12 NOTE — Progress Notes (Signed)
Subjective:  Patient ID: Kelli [name], female    DOB: 07-06-1934,  MRN: [id number]  Kelli [name] presents to clinic today for painful porokeratotic lesion(s) right foot and painful mycotic toenails that limit ambulation. Painful toenails interfere with ambulation. Aggravating factors include wearing enclosed shoe gear. Pain is relieved with periodic professional debridement. Painful porokeratotic lesions are aggravated when weightbearing with and without shoegear. Pain is relieved with periodic professional debridement.  Patient is excited she has a [employer] in [place] this weekend.  New problem(s): None.   PCP is Kandyce Rud, MD.  Allergies  Allergen Reactions   Amoxicillin Rash and Other (See Comments)    Has patient had a PCN reaction causing immediate rash, facial/tongue/throat swelling, SOB or lightheadedness with hypotension: Yes Has patient had a PCN reaction causing severe rash involving mucus membranes or skin necrosis: No Has patient had a PCN reaction that required hospitalization: No Has patient had a PCN reaction occurring within the last 10 years: No If all of the above answers are "NO", then may proceed with Cephalosporin use.   Amoxicillin-Pot Clavulanate Rash   Ampicillin Rash   Cefaclor Rash   Ciprofloxacin Rash   Clindamycin/Lincomycin Diarrhea and Rash   Doxycycline Rash   Erythromycin Ethylsuccinate Rash   Hydrocodone Nausea And Vomiting    Very sensitive to it.Very sleepy   Imipramine Hcl Rash   Ofloxacin Rash   Propoxyphene Rash   Sulfa Antibiotics Rash   Tape Rash and Other (See Comments)    adhesive   Terfenadine Rash   Tobramycin Rash    Review of Systems: Negative except as noted in the HPI.  Objective: No changes noted in today's physical examination. There were no vitals filed for this visit. Kelli [name] is a pleasant 88 y.o. female WD, WN in NAD. AAO x 3.  Vascular Examination: Capillary refill time immediate b/l.  Vascular status intact b/l with palpable pedal pulses. Pedal hair present b/l. No edema. No pain with calf compression b/l. Skin temperature gradient WNL b/l.   Neurological Examination: Sensation grossly intact b/l with 10 gram monofilament. Vibratory sensation intact b/l.   Dermatological Examination: Pedal skin with normal turgor, texture and tone b/l.  No open wounds. No interdigital macerations.   Toenails 1-5 b/l thick, discolored, elongated with subungual debris and pain on dorsal palpation.   Pedal integument with normal turgor, texture and tone b/l LE. No open wounds b/l. No interdigital macerations b/l. Toenails 1-5 b/l elongated, thickened, discolored with subungual debris. +Tenderness with dorsal palpation of nailplates.   Porokeratotic lesion(s) noted submet head 4 right foot.  Musculoskeletal Examination: Muscle strength 5/5 to all lower extremity muscle groups bilaterally. Hallux valgus with bunion deformity noted right foot.  Radiographs: None  Assessment/Plan: 1. Pain due to onychomycosis of toenail   2. Porokeratosis   3. Pain in right foot     -Patient was evaluated and treated. All patient's and/or POA's questions/concerns answered on today's visit. -Patient to continue soft, supportive shoe gear daily. -Mycotic toenails 1-5 bilaterally were debrided in length and girth with sterile nail nippers and dremel without incident. -Porokeratotic lesion(s) submet head 4 right foot pared and enucleated with sterile currette without incident. Total number of lesions debrided=1. -Patient/POA to call should there be question/concern in the interim.   Return in about 3 months (around 01/06/2023).  Freddie Breech, DPM

## 2022-10-28 IMAGING — DX DG WRIST COMPLETE 3+V*L*
4 series · 4 of 4 positions shown · non-contrast
Comparison: None.

CLINICAL DATA: Trauma. Dose for ball playing [employer]. Fell and
hit back of head. Left forearm skin tear and bleeding. Pain.

EXAM:
LEFT WRIST - COMPLETE 3+ VIEW

[wrist ap (1 of 2)]
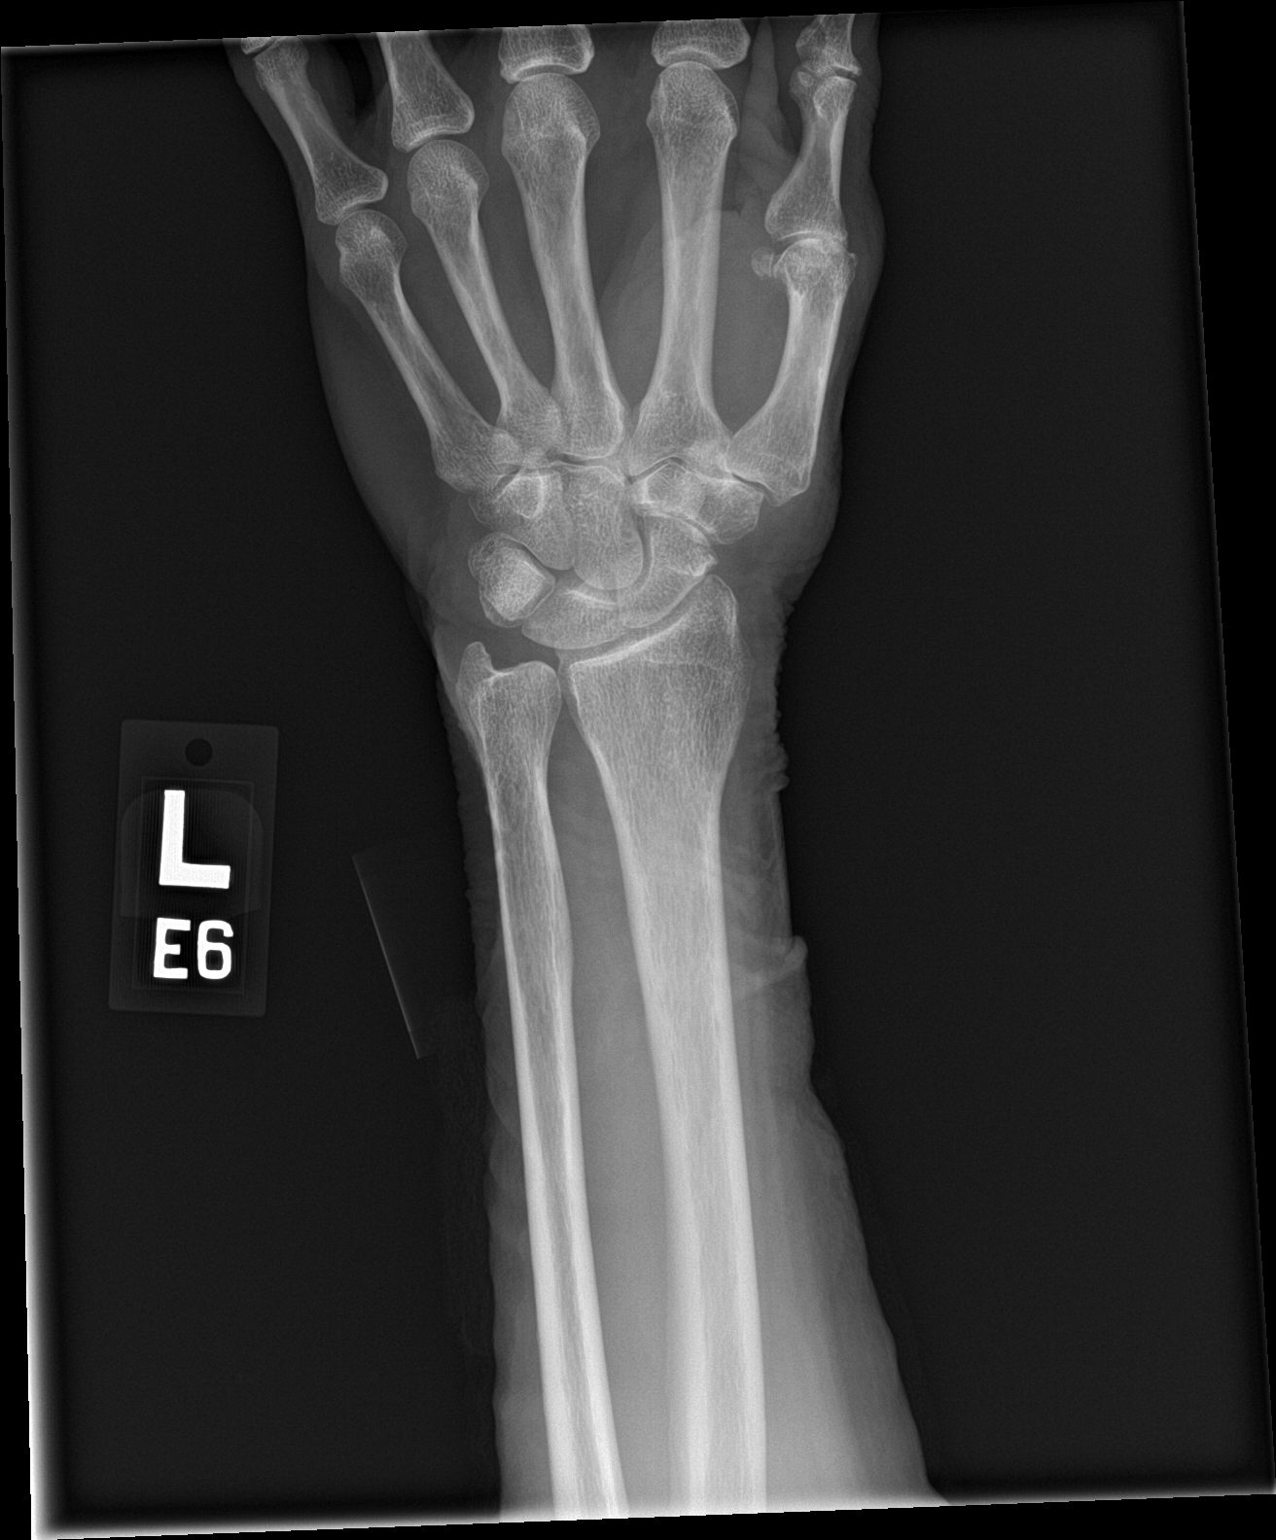

[wrist obl]
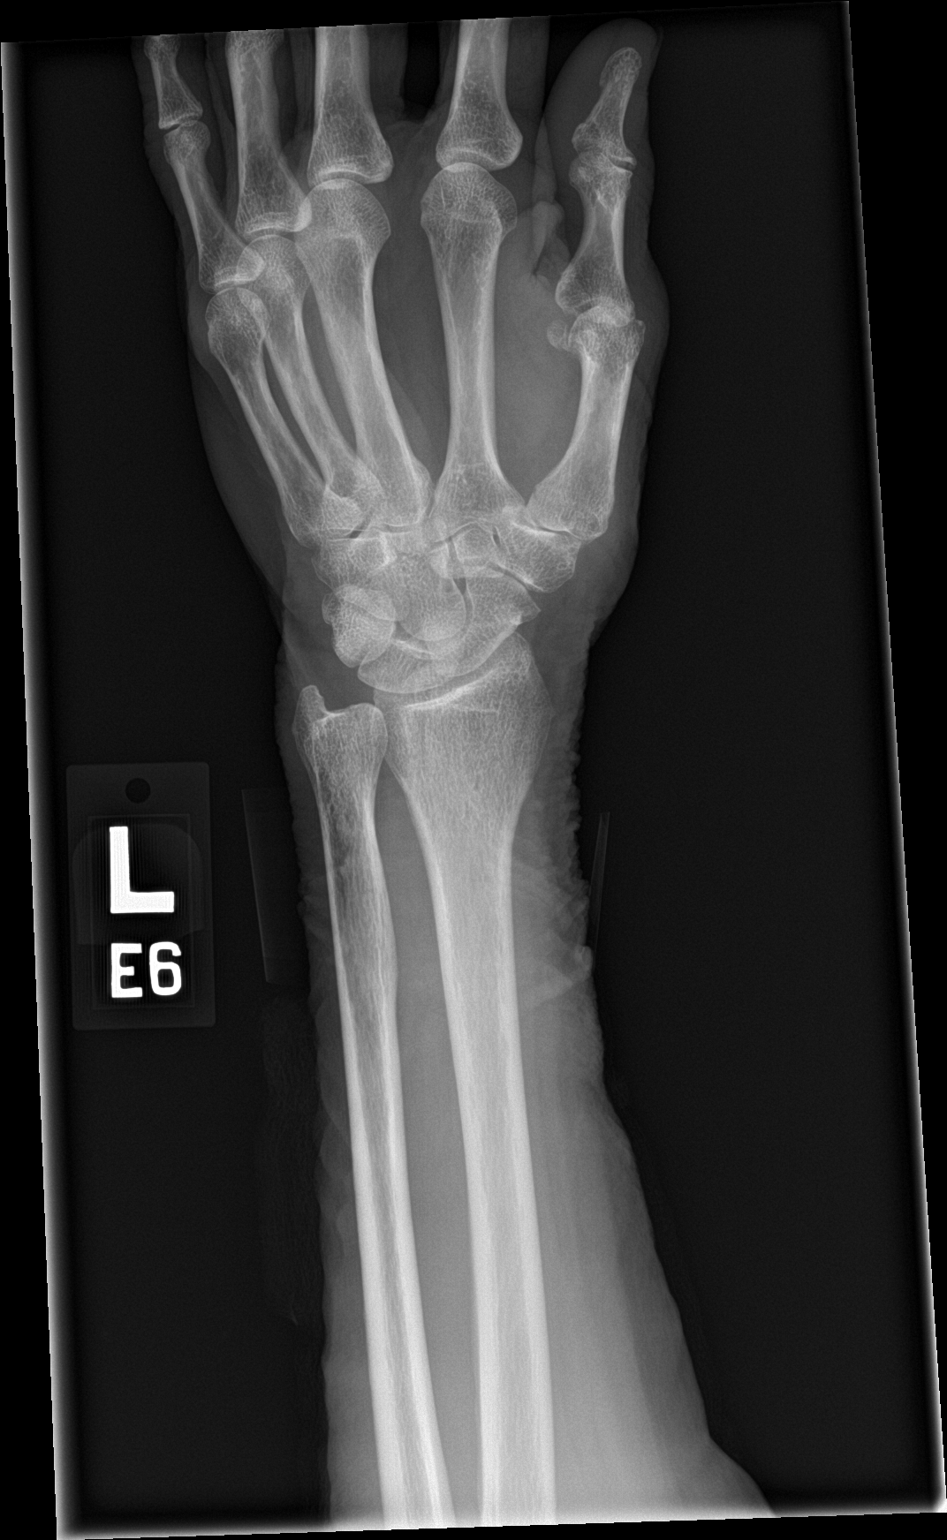

[wrist lat]
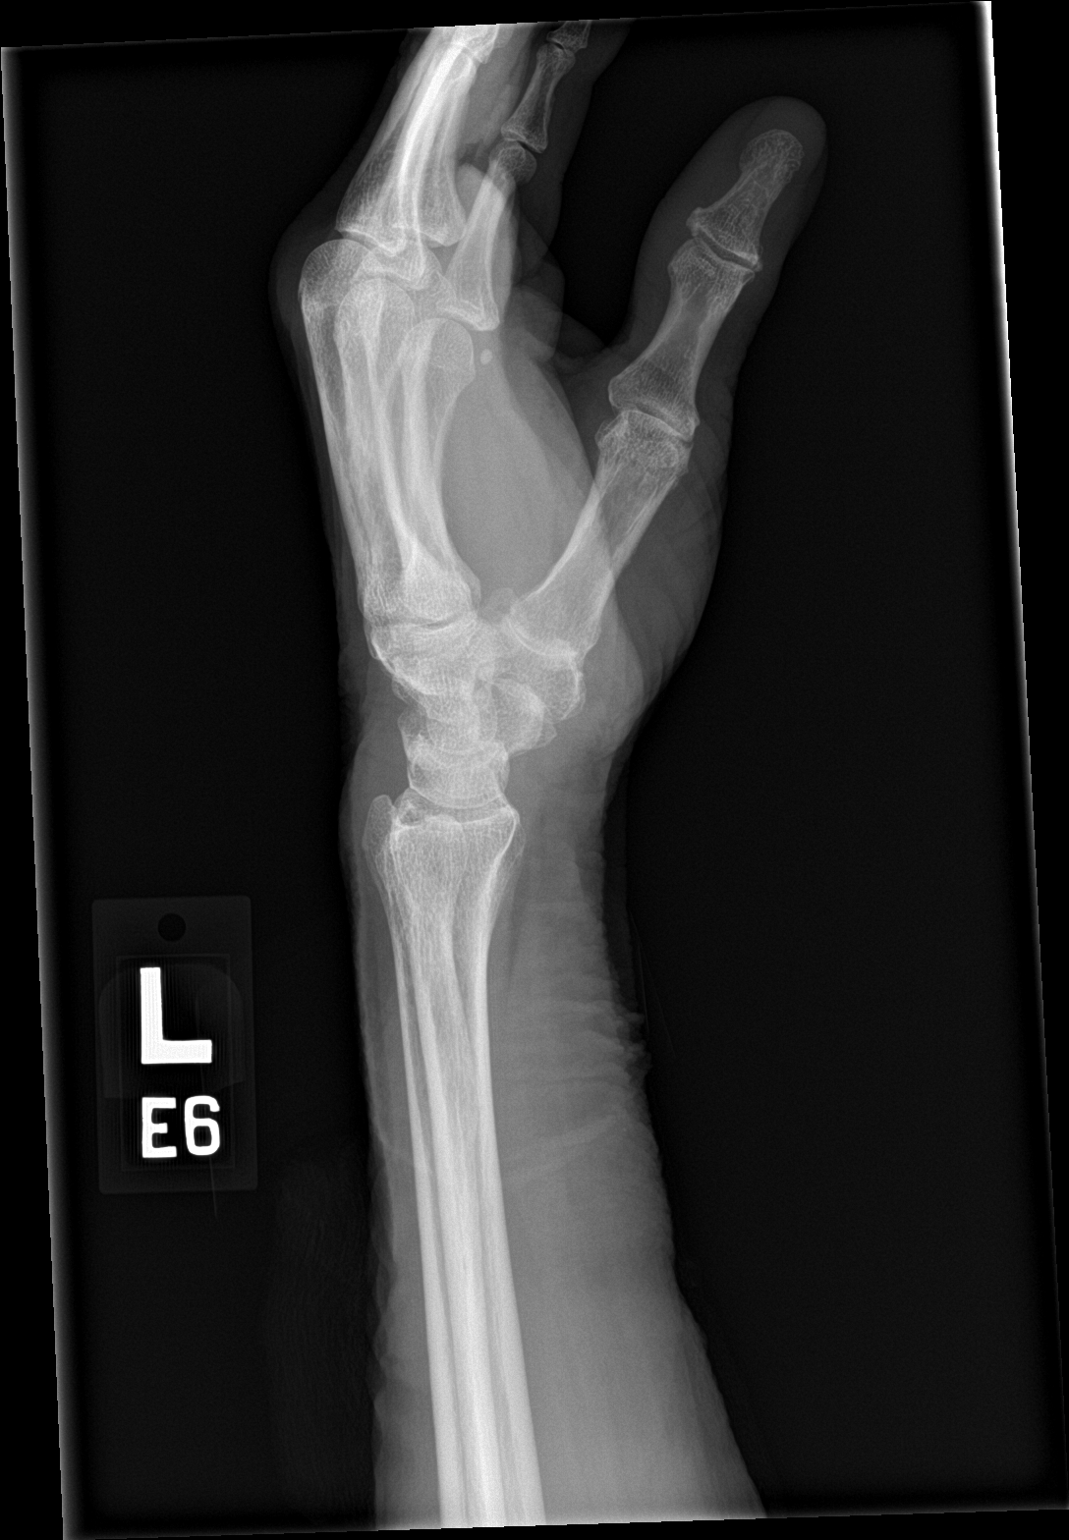

[wrist ap (2 of 2)]
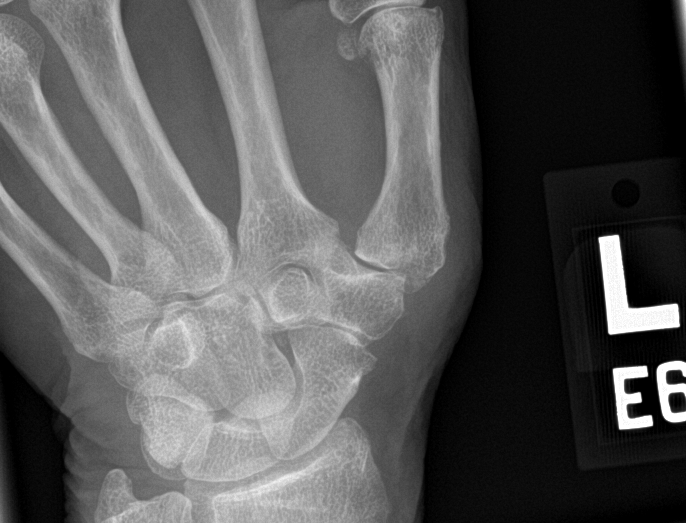

[4 of 4 positions shown; findings below may reference images not displayed]

FINDINGS: Minimal ulnar positive variance. Moderate to severe triscaphe joint
and moderate thumb carpometacarpal joint space narrowing. Mild
peripheral thumb carpometacarpal degenerative osteophytosis. Mild
joint space narrowing and peripheral osteophytosis of the thumb
interphalangeal joint.

No acute fracture or dislocation.
IMPRESSION: :
IMPRESSION: 1. Moderate osteoarthritis of the triscaphe joint and thumb
carpometacarpal joint.
2. No acute fracture is seen.

## 2023-01-06 ENCOUNTER — Ambulatory Visit: Payer: Medicare PPO | Admitting: Podiatry

## 2023-01-06 ENCOUNTER — Encounter: Payer: Self-pay | Admitting: Podiatry

## 2023-01-06 VITALS — Ht <= 58 in | Wt 127.2 lb

## 2023-01-06 DIAGNOSIS — Q828 Other specified congenital malformations of skin: Secondary | ICD-10-CM

## 2023-01-06 DIAGNOSIS — M79676 Pain in unspecified toe(s): Secondary | ICD-10-CM | POA: Diagnosis not present

## 2023-01-06 DIAGNOSIS — M79671 Pain in right foot: Secondary | ICD-10-CM | POA: Diagnosis not present

## 2023-01-06 DIAGNOSIS — B351 Tinea unguium: Secondary | ICD-10-CM

## 2023-01-11 NOTE — Progress Notes (Signed)
Subjective:  Patient ID: Kelli [name], female    DOB: 24-Sep-1934,  MRN: [id number]  Kelli [name] presents to clinic today for painful porokeratotic lesion(s) right foot and painful mycotic toenails that limit ambulation. Painful toenails interfere with ambulation. Aggravating factors include wearing enclosed shoe gear. Pain is relieved with periodic professional debridement. Painful porokeratotic lesions are aggravated when weightbearing with and without shoegear. Pain is relieved with periodic professional debridement.   Patient states she won 2nd place in the [employer]. Chief Complaint  Patient presents with   Nail Problem    Pt is here for RFC, not a diabetic PCP is Dr Larwance Sachs and LOV was last week.   New problem(s): None.   PCP is Kandyce Rud, MD.  Allergies  Allergen Reactions   Amoxicillin Rash and Other (See Comments)    Has patient had a PCN reaction causing immediate rash, facial/tongue/throat swelling, SOB or lightheadedness with hypotension: Yes Has patient had a PCN reaction causing severe rash involving mucus membranes or skin necrosis: No Has patient had a PCN reaction that required hospitalization: No Has patient had a PCN reaction occurring within the last 10 years: No If all of the above answers are "NO", then may proceed with Cephalosporin use.   Amoxicillin-Pot Clavulanate Rash   Ampicillin Rash   Cefaclor Rash   Ciprofloxacin Rash   Clindamycin/Lincomycin Diarrhea and Rash   Doxycycline Rash   Erythromycin Ethylsuccinate Rash   Hydrocodone Nausea And Vomiting    Very sensitive to it.Very sleepy   Imipramine Hcl Rash   Ofloxacin Rash   Propoxyphene Rash   Sulfa Antibiotics Rash   Tape Rash and Other (See Comments)    adhesive   Terfenadine Rash   Tobramycin Rash    Review of Systems: Negative except as noted in the HPI.  Objective: No changes noted in today's physical examination. There were no vitals filed for this visit. Kelli [name] is a pleasant 88 y.o. female WD, WN in NAD. AAO x 3.  Vascular Examination: Capillary refill time immediate b/l. Vascular status intact b/l with palpable pedal pulses. Pedal hair present b/l. No edema. No pain with calf compression b/l. Skin temperature gradient WNL b/l.   Neurological Examination: Sensation grossly intact b/l with 10 gram monofilament. Vibratory sensation intact b/l.   Dermatological Examination: Pedal skin with normal turgor, texture and tone b/l.  No open wounds. No interdigital macerations.   Toenails 1-5 b/l thick, discolored, elongated with subungual debris and pain on dorsal palpation.   Pedal integument with normal turgor, texture and tone b/l LE. No open wounds b/l. No interdigital macerations b/l. Toenails 1-5 b/l elongated, thickened, discolored with subungual debris. +Tenderness with dorsal palpation of nailplates.   Porokeratotic lesion(s) noted submet head 4 right foot.  Musculoskeletal Examination: Muscle strength 5/5 to all lower extremity muscle groups bilaterally. Hallux valgus with bunion deformity noted right foot.  Radiographs: None  Assessment/Plan: 1. Pain due to onychomycosis of toenail   2. Porokeratosis   3. Pain in right foot     -Consent given for treatment as described below: -Examined patient. -Patient to continue soft, supportive shoe gear daily. -Toenails 1-5 b/l were debrided in length and girth with sterile nail nippers and dremel without iatrogenic bleeding.  -Porokeratotic lesion(s) submet head 4 right foot pared and enucleated with sterile currette without incident. Total number of lesions debrided=1. -Applied padding to submet head 4 lesions for cushioning for comfort after debridement. -Patient/POA to call should there be question/concern  in the interim.   Return in about 3 months (around 04/06/2023).  Freddie Breech, DPM      [place] LOCATION: [address], [place] [address]                   Office [phone]   [facility] LOCATION: [address] [place], [place] [address] Office [phone]

## 2023-04-28 ENCOUNTER — Encounter: Payer: Self-pay | Admitting: Podiatry

## 2023-04-28 ENCOUNTER — Ambulatory Visit: Payer: Medicare PPO | Admitting: Podiatry

## 2023-04-28 DIAGNOSIS — Q828 Other specified congenital malformations of skin: Secondary | ICD-10-CM | POA: Diagnosis not present

## 2023-04-28 DIAGNOSIS — B351 Tinea unguium: Secondary | ICD-10-CM | POA: Diagnosis not present

## 2023-04-28 DIAGNOSIS — M79676 Pain in unspecified toe(s): Secondary | ICD-10-CM | POA: Diagnosis not present

## 2023-04-28 DIAGNOSIS — M79671 Pain in right foot: Secondary | ICD-10-CM

## 2023-05-03 ENCOUNTER — Encounter: Payer: Self-pay | Admitting: Podiatry

## 2023-05-03 NOTE — Progress Notes (Signed)
 Subjective:  Patient ID: Kelli [name], female    DOB: 1934/03/21,  MRN: [id number]  89 y.o. female presents painful porokeratotic lesion(s) right lower extremity and painful mycotic toenails that limit ambulation. Painful toenails interfere with ambulation. Aggravating factors include wearing enclosed shoe gear. Pain is relieved with periodic professional debridement. Painful porokeratotic lesions are aggravated when weightbearing with and without shoegear. Pain is relieved with periodic professional debridement. Chief Complaint  Patient presents with   Nail Problem    "Trim my toenails."   New problem(s): None   PCP is Nestor Banter, MD , and last visit was January 03, 2023.  Allergies  Allergen Reactions   Amoxicillin Rash and Other (See Comments)    Has patient had a PCN reaction causing immediate rash, facial/tongue/throat swelling, SOB or lightheadedness with hypotension: Yes Has patient had a PCN reaction causing severe rash involving mucus membranes or skin necrosis: No Has patient had a PCN reaction that required hospitalization: No Has patient had a PCN reaction occurring within the last 10 years: No If all of the above answers are "NO", then may proceed with Cephalosporin use.   Amoxicillin-Pot Clavulanate Rash   Ampicillin Rash   Cefaclor Rash   Ciprofloxacin Rash   Clindamycin/Lincomycin Diarrhea and Rash   Doxycycline Rash   Erythromycin Ethylsuccinate Rash   Hydrocodone Nausea And Vomiting    Very sensitive to it.Very sleepy   Imipramine Hcl Rash   Ofloxacin Rash   Propoxyphene Rash   Sulfa Antibiotics Rash   Tape Rash and Other (See Comments)    adhesive   Terfenadine Rash   Tobramycin Rash    Review of Systems: Negative except as noted in the HPI.   Objective:  Kelli [name] is a pleasant 89 y.o. female WD, WN in NAD. AAO x 3.  Vascular Examination: Vascular status intact b/l with palpable pedal pulses. CFT immediate b/l. Pedal hair present. No  edema. No pain with calf compression b/l. Skin temperature gradient WNL b/l. No varicosities noted. No cyanosis or clubbing noted.  Neurological Examination: Sensation grossly intact b/l with 10 gram monofilament. Vibratory sensation intact b/l.  Dermatological Examination: Pedal skin with normal turgor, texture and tone b/l. No open wounds nor interdigital macerations noted. Toenails 1-5 b/l thick, discolored, elongated with subungual debris and pain on dorsal palpation.   Porokeratotic lesion(s) submet head 4 right foot. No erythema, no edema, no drainage, no fluctuance.  Musculoskeletal Examination: Muscle strength 5/5 to b/l LE.  No pain, crepitus noted b/l. HAV with bunion deformity noted b/l LE.  Radiographs: None  Last A1c:       No data to display         Assessment:   1. Pain due to onychomycosis of toenail   2. Porokeratosis   3. Pain in right foot    Plan:  Patient was evaluated and treated. All patient's and/or POA's questions/concerns addressed on today's visit. Mycotic toenails 1-5 debrided in length and girth without incident. Porokeratotic lesion(s) submet head 4 right foot pared with sharp debridement without incident. Continue soft, supportive shoe gear daily. Report any pedal injuries to medical professional. Call office if there are any questions/concerns. -Patient/POA to call should there be question/concern in the interim.  Return in about 3 months (around 07/28/2023).  Luella Sager, DPM      [place] LOCATION: [address]  [place], [place] [address]                   Office [phone]   [facility] LOCATION: [address] [place], [place] [address] Office [phone]

## 2023-08-04 ENCOUNTER — Encounter: Payer: Self-pay | Admitting: Podiatry

## 2023-08-04 ENCOUNTER — Ambulatory Visit: Admitting: Podiatry

## 2023-08-04 DIAGNOSIS — B351 Tinea unguium: Secondary | ICD-10-CM

## 2023-08-04 DIAGNOSIS — M79676 Pain in unspecified toe(s): Secondary | ICD-10-CM

## 2023-08-04 DIAGNOSIS — Q828 Other specified congenital malformations of skin: Secondary | ICD-10-CM | POA: Diagnosis not present

## 2023-08-04 DIAGNOSIS — I8393 Asymptomatic varicose veins of bilateral lower extremities: Secondary | ICD-10-CM | POA: Diagnosis not present

## 2023-08-04 DIAGNOSIS — M79671 Pain in right foot: Secondary | ICD-10-CM | POA: Diagnosis not present

## 2023-08-10 NOTE — Progress Notes (Signed)
 Subjective:  Patient ID: Kelli [name], female    DOB: August 28, 1934,  MRN: [id number]  Kelli [name] presents to clinic today for painful porokeratotic lesion(s) right lower extremity and painful mycotic toenails that limit ambulation. Painful toenails interfere with ambulation. Aggravating factors include wearing enclosed shoe gear. Pain is relieved with periodic professional debridement. Painful porokeratotic lesions are aggravated when weightbearing with and without shoegear. Pain is relieved with periodic professional debridement. She is accompanied by her daughter in law, Kelli [name], on today's visit.  Chief Complaint  Patient presents with   Nail Problem    Thick painful toenails, 3 month follow up    There is concern regarding the sock she wore on her right lower extremity which appears to be tight about her lower leg. Patient is very active and likes to participate in ping pong tournaments.  She also has a lesion noted on her right lower leg. Unsure when it appeared.   PCP is Diedra Lame, MD.  Allergies  Allergen Reactions   Amoxicillin Rash and Other (See Comments)    Has patient had a PCN reaction causing immediate rash, facial/tongue/throat swelling, SOB or lightheadedness with hypotension: Yes Has patient had a PCN reaction causing severe rash involving mucus membranes or skin necrosis: No Has patient had a PCN reaction that required hospitalization: No Has patient had a PCN reaction occurring within the last 10 years: No If all of the above answers are NO, then may proceed with Cephalosporin use.   Amoxicillin-Pot Clavulanate Rash   Ampicillin Rash   Cefaclor Rash   Ciprofloxacin Rash   Clindamycin/Lincomycin Diarrhea and Rash   Doxycycline Rash   Erythromycin Ethylsuccinate Rash   Hydrocodone Nausea And Vomiting    Very sensitive to it.Very sleepy   Imipramine Hcl Rash   Ofloxacin Rash   Propoxyphene Rash   Sulfa Antibiotics Rash   Tape Rash and Other (See  Comments)    adhesive   Terfenadine Rash   Tobramycin Rash    Review of Systems: Negative except as noted in the HPI.  Objective: No changes noted in today's physical examination. There were no vitals filed for this visit. Kelli [name] is a pleasant 89 y.o. female WD, WN in NAD. AAO x 3.  Vascular Examination: Vascular status intact b/l with palpable pedal pulses. CFT immediate b/l. Pedal hair diminished. No pain with calf compression b/l. Skin temperature gradient WNL b/l. Trace edema noted BLE. Varicosities present b/l.  Neurological Examination: Sensation grossly intact b/l with 10 gram monofilament. Vibratory sensation intact b/l.  Dermatological Examination: Pedal skin with normal turgor, texture and tone b/l. No open wounds nor interdigital macerations noted. Toenails 1-5 b/l thick, discolored, elongated with subungual debris and pain on dorsal palpation.   Porokeratotic lesion(s) submet head 4 right foot. No erythema, no edema, no drainage, no fluctuance.  Musculoskeletal Examination: Muscle strength 5/5 to b/l LE.  No pain, crepitus noted b/l. HAV with bunion deformity noted b/l LE.  Radiographs: None Assessment/Plan: 1. Pain due to onychomycosis of toenail   2. 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 Porokeratosis   4. Pain in right foot     Patient was evaluated and treated. All patient's and/or POA's questions/concerns addressed on today's visit. Mycotic toenails 1-5 debrided in length and girth without incident. Porokeratotic lesion(s) submet head 4 right foot pared with sharp debridement without incident. Continue soft, supportive shoe gear daily. Report any pedal injuries to medical professional.  -For varicosities of b/l lower extremities,  recommend below knee compression hose 15-20 mmHg. Will discuss with her Cardiologist on her next visit. -On days when she does not wear compression hose, recommend diabetic socks which will not be  constrictive. -Patient will discuss lesion RLE with PCP. -Mycotic toenails 1-5 bilaterally were debrided in length and girth with sterile nail nippers and dremel without incident. -Continues soft supportive shoe gear. -Patient/POA to call should there be question/concern in the interim.   Return in about 3 months (around 11/04/2023).  Delon LITTIE Merlin, DPM      [place] LOCATION: [address] [place] [address]                   Office [phone]   [facility] LOCATION: [address] [place], [place] [address] Office [phone]

## 2023-09-11 ENCOUNTER — Ambulatory Visit (INDEPENDENT_AMBULATORY_CARE_PROVIDER_SITE_OTHER): Payer: Medicare PPO | Admitting: Dermatology

## 2023-09-11 ENCOUNTER — Encounter: Payer: Self-pay | Admitting: Dermatology

## 2023-09-11 DIAGNOSIS — D229 Melanocytic nevi, unspecified: Secondary | ICD-10-CM

## 2023-09-11 DIAGNOSIS — W908XXA Exposure to other nonionizing radiation, initial encounter: Secondary | ICD-10-CM

## 2023-09-11 DIAGNOSIS — D1801 Hemangioma of skin and subcutaneous tissue: Secondary | ICD-10-CM

## 2023-09-11 DIAGNOSIS — L814 Other melanin hyperpigmentation: Secondary | ICD-10-CM | POA: Diagnosis not present

## 2023-09-11 DIAGNOSIS — Z1283 Encounter for screening for malignant neoplasm of skin: Secondary | ICD-10-CM | POA: Diagnosis not present

## 2023-09-11 DIAGNOSIS — L578 Other skin changes due to chronic exposure to nonionizing radiation: Secondary | ICD-10-CM | POA: Diagnosis not present

## 2023-09-11 DIAGNOSIS — L821 Other seborrheic keratosis: Secondary | ICD-10-CM

## 2023-09-11 NOTE — Progress Notes (Signed)
   New Patient Visit   Subjective  Kelli [name] is a 89 y.o. female who presents for the following: Skin Cancer Screening and Full Body Skin Exam. No personal Hx of skin cancer or dysplastic nevi.   The patient presents for Total-Body Skin Exam (TBSE) for skin cancer screening and mole check. The patient has spots, moles and lesions to be evaluated, some may be new or changing and the patient may have concern these could be cancer.  Son, Marinell, was with patient and contributes to history. Waited in lobby during skin exam.   The following portions of the chart were reviewed this encounter and updated as appropriate: medications, allergies, medical history  Review of Systems:  No other skin or systemic complaints except as noted in HPI or Assessment and Plan.  Objective  Well appearing patient in no apparent distress; mood and affect are within normal limits.  A full examination was performed including scalp, head, eyes, ears, nose, lips, neck, chest, axillae, abdomen, back, buttocks, bilateral upper extremities, bilateral lower extremities, hands, feet, fingers, toes, fingernails, and toenails. All findings within normal limits unless otherwise noted below.   Relevant physical exam findings are noted in the Assessment and Plan.  Exam of nails limited by presence of nail polish.    Assessment & Plan   SKIN CANCER SCREENING PERFORMED TODAY.  ACTINIC DAMAGE - Chronic condition, secondary to cumulative UV/sun exposure - diffuse scaly erythematous macules with underlying dyspigmentation - Recommend daily broad spectrum sunscreen SPF 30+ to sun-exposed areas, reapply every 2 hours as needed.  - Staying in the shade or wearing long sleeves, sun glasses (UVA+UVB protection) and wide brim hats (4-inch brim around the entire circumference of the hat) are also recommended for sun protection.  - Call for new or changing lesions.  LENTIGINES, SEBORRHEIC KERATOSES, HEMANGIOMAS - Benign normal  skin lesions - Benign-appearing - Call for any changes  MELANOCYTIC NEVI - Tan-brown and/or pink-flesh-colored symmetric macules and papules - Benign appearing on exam today - Observation - Call clinic for new or changing moles - Recommend daily use of broad spectrum spf 30+ sunscreen to sun-exposed areas.  - Check nails when remove polish.    MULTIPLE BENIGN NEVI   LENTIGINES   SEBORRHEIC KERATOSES   ACTINIC ELASTOSIS   CHERRY ANGIOMA    Return in about 1 year (around 09/10/2024) for TBSE.  I, Kate Fought, CMA, am acting as scribe for Boneta Sharps, MD.   Documentation: I have reviewed the above documentation for accuracy and completeness, and I agree with the above.  Boneta Sharps, MD

## 2023-09-11 NOTE — Patient Instructions (Signed)

## 2023-11-10 ENCOUNTER — Ambulatory Visit: Admitting: Podiatry

## 2023-11-10 DIAGNOSIS — B351 Tinea unguium: Secondary | ICD-10-CM | POA: Diagnosis not present

## 2023-11-10 DIAGNOSIS — M79671 Pain in right foot: Secondary | ICD-10-CM | POA: Diagnosis not present

## 2023-11-10 DIAGNOSIS — M79676 Pain in unspecified toe(s): Secondary | ICD-10-CM

## 2023-11-10 DIAGNOSIS — Q828 Other specified congenital malformations of skin: Secondary | ICD-10-CM | POA: Diagnosis not present

## 2023-11-10 DIAGNOSIS — D689 Coagulation defect, unspecified: Secondary | ICD-10-CM

## 2023-11-13 ENCOUNTER — Encounter: Payer: Self-pay | Admitting: Advanced Practice Midwife

## 2023-11-13 ENCOUNTER — Ambulatory Visit (INDEPENDENT_AMBULATORY_CARE_PROVIDER_SITE_OTHER): Admitting: Advanced Practice Midwife

## 2023-11-13 VITALS — BP 136/79 | HR 86 | Wt 129.9 lb

## 2023-11-13 DIAGNOSIS — R399 Unspecified symptoms and signs involving the genitourinary system: Secondary | ICD-10-CM | POA: Diagnosis not present

## 2023-11-13 DIAGNOSIS — R300 Dysuria: Secondary | ICD-10-CM

## 2023-11-13 LAB — POCT URINALYSIS DIPSTICK
Bilirubin, UA: NEGATIVE
Glucose, UA: NEGATIVE
Ketones, UA: NEGATIVE
Nitrite, UA: NEGATIVE
Protein, UA: POSITIVE — AB
Spec Grav, UA: 1.02 (ref 1.010–1.025)
Urobilinogen, UA: 0.2 U/dL
pH, UA: 5 (ref 5.0–8.0)

## 2023-11-13 NOTE — Progress Notes (Signed)
 Patient ID: Kelli [name], female   DOB: 06-12-1934, 89 y.o.   MRN: [id number]  Reason for Consult: Dysuria   Subjective:  Date of Service: 11/13/2023  HPI:  Kelli [name] is a 89 y.o. female accompanied by her son who presents with concerns of dysuria for 2 days. Per her son, she has been diagnosed with dementia. She tends to delay bathroom breaks due to being involved in various activities. She denies urinary urgency. She has an active lifestyle including dancing. She reports a history of UTI and says she generally stays well hydrated. She has allergies to a number of antibiotics. We discussed running a urine culture based on results of her urinalysis that are suggestive of UTI, however, it is recommended that we await prescribing antibiotics pending culture result. The patient and her son agree with the plan.   Update to visit: the patient's daughter called and expressed concern that her mother may have been in pain longer than she stated and she is worried about her mother being in pain over the weekend without treatment. Rx macrobid  sent on 10/24.  Urine culture negative- resulted on 10/26. Left voicemail for patient and her son (alternate contact) regarding result.  Past Medical History:  Diagnosis Date   Arthritis    Osteoporosis   Autoimmune disease    Bradycardia    Burning with urination    Cyst of kidney, acquired    Diverticulosis    Eczema    Edema    feet/ankles   Liver cyst    Lower extremity edema    Neuralgia    obsidia   Neuralgia    OBSIDIA   Pacemaker    Pain    back/shoulder   Pain    BACK,SHOULDER   Platelets decreased    Raynaud disease    Raynaud's disease    Family History  Problem Relation Age of Onset   Breast cancer Sister 74   Ovarian cancer Neg Hx    Colon cancer Neg Hx    Past Surgical History:  Procedure Laterality Date   ABDOMINAL HYSTERECTOMY     CATARACT EXTRACTION W/PHACO Right 08/08/2014   Procedure: CATARACT EXTRACTION  PHACO AND INTRAOCULAR LENS PLACEMENT (IOC);  Surgeon: Steven Dingeldein, MD;  Location: [facility];  Service: Ophthalmology;  Laterality: Right;  US :00:52 AP:24.8 CDE:22.37 Pack Lot #:[id number]   CATARACT EXTRACTION W/PHACO Left 09/19/2014   Procedure: CATARACT EXTRACTION PHACO AND INTRAOCULAR LENS PLACEMENT (IOC);  Surgeon: Steven Dingeldein, MD;  Location: [facility];  Service: Ophthalmology;  Laterality: Left;  US : 01:05.6 AP%: 25.7 CDE: 29.05 Lot # [id number]   CHOLECYSTECTOMY     EYE SURGERY     bilateral cataract   PACEMAKER INSERTION N/A 10/23/2017   Procedure: INSERTION PACEMAKER-DUAL CHAMBER INITIAL;  Surgeon: Ammon Blunt, MD;  Location: [facility];  Service: Cardiovascular;  Laterality: N/A;   PARATHYROIDECTOMY     partial   SHOULDER ARTHROSCOPY WITH OPEN ROTATOR CUFF REPAIR Right 08/08/2015   Procedure: SHOULDER ARTHROSCOPY WITH OPEN ROTATOR CUFF REPAIR;  Surgeon: Norleen JINNY Maltos, MD;  Location: [facility];  Service: Orthopedics;  Laterality: Right;    Short Social History:  Social History   Tobacco Use   Smoking status: Never   Smokeless tobacco: Never  Substance Use Topics   Alcohol use: No    Allergies  Allergen Reactions   Amoxicillin Rash and Other (See Comments)    Has patient had a PCN reaction causing immediate rash, facial/tongue/throat swelling, SOB or lightheadedness with hypotension:  Yes Has patient had a PCN reaction causing severe rash involving mucus membranes or skin necrosis: No Has patient had a PCN reaction that required hospitalization: No Has patient had a PCN reaction occurring within the last 10 years: No If all of the above answers are NO, then may proceed with Cephalosporin use.   Amoxicillin-Pot Clavulanate Rash   Ampicillin Rash   Cefaclor Rash   Ciprofloxacin Rash   Clindamycin/Lincomycin Diarrhea and Rash   Doxycycline Rash   Erythromycin Ethylsuccinate Rash   Hydrocodone Nausea And Vomiting    Very sensitive to it.Very sleepy    Imipramine Hcl Rash   Ofloxacin Rash   Propoxyphene Rash   Sulfa Antibiotics Rash   Tape Rash and Other (See Comments)    adhesive   Terfenadine Rash   Tobramycin Rash    Current Outpatient Medications  Medication Sig Dispense Refill   atorvastatin (LIPITOR) 10 MG tablet Take 10 mg by mouth daily.     memantine (NAMENDA) 10 MG tablet Take 10 mg by mouth 2 (two) times daily.     Multiple Vitamin (MULTI-VITAMIN) tablet Take by mouth.     nortriptyline (PAMELOR) 10 MG capsule Take 10 mg nightly     predniSONE  (DELTASONE ) 1 MG tablet Take 1 mg by mouth 2 (two) times daily.     UNABLE TO FIND Take by mouth.     nitrofurantoin , macrocrystal-monohydrate, (MACROBID ) 100 MG capsule Take 1 capsule (100 mg total) by mouth 2 (two) times daily. 14 capsule 1   No current facility-administered medications for this visit.    Review of Systems  Constitutional:  Negative for chills and fever.  HENT:  Negative for congestion, ear discharge, ear pain, hearing loss, sinus pain and sore throat.   Eyes:  Negative for blurred vision and double vision.  Respiratory:  Negative for cough, shortness of breath and wheezing.   Cardiovascular:  Negative for chest pain, palpitations and leg swelling.  Gastrointestinal:  Negative for abdominal pain, blood in stool, constipation, diarrhea, heartburn, melena, nausea and vomiting.  Genitourinary:  Positive for dysuria. Negative for flank pain, frequency, hematuria and urgency.  Musculoskeletal:  Negative for back pain, joint pain and myalgias.  Skin:  Negative for itching and rash.  Neurological:  Negative for dizziness, tingling, tremors, sensory change, speech change, focal weakness, seizures, loss of consciousness, weakness and headaches.  Endo/Heme/Allergies:  Negative for environmental allergies. Does not bruise/bleed easily.  Psychiatric/Behavioral:  Negative for depression, hallucinations, memory loss, substance abuse and suicidal ideas. The patient is not  nervous/anxious and does not have insomnia.        Objective:  Objective   Vitals:   11/13/23 1425 11/13/23 1449  BP: (!) 147/82 136/79  Pulse: 87 86  Weight: 129 lb 14.4 oz (58.9 kg)    Body mass index is 28.11 kg/m. Constitutional: Well nourished, well developed female in no acute distress.  HEENT: normal Skin: Warm and dry.  Cardiovascular: Regular rate and rhythm.   Extremity: no edema Respiratory:  Normal respiratory effort Back: no CVAT Psych: Alert and Oriented x3. Able to recall memory associated with dysuria. Normal mood and affect.     Data:  Latest Reference Range & Units 11/13/23 14:37  Bilirubin, UA  negative  Clarity, UA  clear  Color, UA  yellow  Glucose Negative  Negative  Ketones, UA  negative  Leukocytes,UA Negative  Large (3+) !  Nitrite, UA  negative  pH, UA 5.0 - 8.0  5.0  Protein,UA Negative  Positive !  Specific Gravity, UA 1.010 - 1.025  1.020  Urobilinogen, UA 0.2 or 1.0 E.U./dL 0.2  RBC, UA  large  !: Data is abnormal      11/13/23 15:39  Organism ID, Bacteria No growth  Urine Culture Rpt  Rpt: View report in Results Review for more information Assessment/Plan:     89 y.o. G2 P79 female with dysuria, possible UTI (normal culture resulted by time of note writing)  UA Urine culture Rx Macrobid  Follow up as needed (left voicemail message for patient and her son regarding normal urine culture) Discontinue antibiotics   Slater Rains, CNM [facility] 11/19/2023 8:15 AM

## 2023-11-13 NOTE — Patient Instructions (Signed)
 Dysuria Dysuria is pain or discomfort when you pee. The pain may be felt in your urethra, which is the part of your body that drains pee (urine) from your bladder. The pain may also be felt near your genitals, groin, or in your lower belly or back. You may have to pee often or have the sudden feeling that you need to pee. This condition can affect anyone, but it's more common in females. It can be caused by: A urinary tract infection (UTI). Kidney stones or bladder stones. Some sexually transmitted infections (STIs). Dehydration. This is when there's not enough water in your body. Irritation and swelling in the vagina. The use of some medicines. The use of some soaps or products with a scent. Follow these instructions at home: Medicines  Take your medicines only as told. Take your antibiotics as told. Do not stop taking them even if you start to feel better. Eating and drinking Drink enough fluid to keep your pee pale yellow. Certain drinks can make the pain worse. Avoid: Drinks with caffeine in them. Tea. Alcohol. In males, alcohol may irritate the prostate. General instructions Watch your condition for any changes, such as color changes in your pee. Pee often. Do not hold your pee for a long time. If you're female, wipe from front to back after you pee or poop. Use each tissue only once when you wipe. Pee after you have sex. If you've had any tests done, it's up to you to get your test results. Ask your health care provider, or the department doing the test, when your results will be ready. Contact a health care provider if: You have a fever or chills. You have pain in your back or sides. You throw up or feel like you may throw up. You have blood in your pee. You're not peeing as often as normal. You feel very weak. Get help right away if: You have very bad pain that doesn't get better with medicine. You're confused. You have a fast heartbeat while resting. This information is  not intended to replace advice given to you by your health care provider. Make sure you discuss any questions you have with your health care provider. Document Revised: 05/14/2022 Document Reviewed: 05/14/2022 Elsevier Patient Education  2024 [employer].

## 2023-11-14 ENCOUNTER — Other Ambulatory Visit: Payer: Self-pay | Admitting: Advanced Practice Midwife

## 2023-11-14 ENCOUNTER — Telehealth: Payer: Self-pay

## 2023-11-14 DIAGNOSIS — R300 Dysuria: Secondary | ICD-10-CM

## 2023-11-14 MED ORDER — NITROFURANTOIN MONOHYD MACRO 100 MG PO CAPS: 100.0000 mg | ORAL_CAPSULE | Freq: Two times a day (BID) | ORAL | 1 refills | Status: AC

## 2023-11-14 NOTE — Progress Notes (Signed)
 Rx macrobid  sent per patient's daughter's request. Telephone call to patient. Will follow up after urine culture results.

## 2023-11-14 NOTE — Telephone Encounter (Signed)
 Patient's daughter called for urine culture results from urine left yesterday.  Advised results are not back and usually take a few days as the culture needs time to grow.  AU showed 3+ leuks and + protein.  Daughter states her mom has dementia and has likely been in pain for some time, and she does not think she should go all weekend without treatment.  She requests antibiotics to be prescribed while awaiting results.  Advised I will reach out to Slater Rains CNM and see whether this is something she is comfortable with.

## 2023-11-14 NOTE — Telephone Encounter (Signed)
 Spoke to [place] in person.  She advised she would prefer to wait for culture results as patient has numerous allergies to antibiotics and we don't want her to take something that is not going to be effective.  She advised the patient seemed fairly comfortable at her appointment yesterday and she and her son were both okay with waiting until the culture came back.  She agreed to send an antibiotic if the daughter feels strongly about it, but would prefer to not.  I called and left a detailed message (daughter requested this) explaining the reasoning behind not giving her abx without culture results.  Told her to please call back if she wants them anyway.

## 2023-11-16 ENCOUNTER — Encounter: Payer: Self-pay | Admitting: Podiatry

## 2023-11-16 LAB — URINE CULTURE: Organism ID, Bacteria: NO GROWTH

## 2023-11-16 NOTE — Progress Notes (Signed)
 Subjective:  Patient ID: Kelli [name], female    DOB: Jun 18, 1934,  MRN: [id number]  Kelli [name] presents to clinic today for at risk foot care with h/o clotting disorder and painful porokeratotic lesion(s) right foot and painful mycotic toenails that limit ambulation. Painful toenails interfere with ambulation. Aggravating factors include wearing enclosed shoe gear. Pain is relieved with periodic professional debridement. Painful porokeratotic lesions are aggravated when weightbearing with and without shoegear. Pain is relieved with periodic professional debridement.  Chief Complaint  Patient presents with   Toe Pain    RFC. Dr. Diedra is her PCP. Last visit was in June   New problem(s): None.   PCP is Diedra Lame, MD.  Allergies  Allergen Reactions   Amoxicillin Rash and Other (See Comments)    Has patient had a PCN reaction causing immediate rash, facial/tongue/throat swelling, SOB or lightheadedness with hypotension: Yes Has patient had a PCN reaction causing severe rash involving mucus membranes or skin necrosis: No Has patient had a PCN reaction that required hospitalization: No Has patient had a PCN reaction occurring within the last 10 years: No If all of the above answers are NO, then may proceed with Cephalosporin use.   Amoxicillin-Pot Clavulanate Rash   Ampicillin Rash   Cefaclor Rash   Ciprofloxacin Rash   Clindamycin/Lincomycin Diarrhea and Rash   Doxycycline Rash   Erythromycin Ethylsuccinate Rash   Hydrocodone Nausea And Vomiting    Very sensitive to it.Very sleepy   Imipramine Hcl Rash   Ofloxacin Rash   Propoxyphene Rash   Sulfa Antibiotics Rash   Tape Rash and Other (See Comments)    adhesive   Terfenadine Rash   Tobramycin Rash    Review of Systems: Negative except as noted in the HPI.  Objective: No changes noted in today's physical examination. There were no vitals filed for this visit. Kelli [name] is a pleasant 89 y.o. female WD, WN  in NAD. AAO x 3.  Vascular Examination: Vascular status intact b/l with palpable pedal pulses. CFT immediate b/l. Pedal hair diminished. No pain with calf compression b/l. Skin temperature gradient WNL b/l. Trace edema noted BLE. Varicosities present b/l.  Neurological Examination: Sensation grossly intact b/l with 10 gram monofilament. Vibratory sensation intact b/l.  Dermatological Examination: Pedal skin with normal turgor, texture and tone b/l. No open wounds nor interdigital macerations noted. Toenails 1-5 b/l thick, discolored, elongated with subungual debris and pain on dorsal palpation.   Porokeratotic lesion(s) submet head 4 right foot. No erythema, no edema, no drainage, no fluctuance.  Musculoskeletal Examination: Muscle strength 5/5 to b/l LE.  No pain, crepitus noted b/l. HAV with bunion deformity noted b/l LE.  Radiographs: None  Assessment/Plan: 1. Pain due to onychomycosis of toenail   2. Porokeratosis   3. Pain in right foot   4. Clotting disorder   Consent given for treatment. Patient examined. All patient's and/or POA's questions/concerns addressed on today's visit. Toenails 1-5 b/l debrided in length and girth without incident. Porokeratotic lesion(s) submet head 4 right foot pared and enucleated with sharp debridement without incident. Continue soft, supportive shoe gear daily. Report any pedal injuries to medical professional. Call office if there are any questions/concerns.  Return in about 3 months (around 02/10/2024).  Delon LITTIE Merlin, DPM      [facility] LOCATION: [address]  [place], [place] [address]                   Office [phone]   [facility] LOCATION: [address] [place], [place] [address] Office [phone]

## 2023-11-19 ENCOUNTER — Telehealth: Payer: Self-pay

## 2023-11-19 ENCOUNTER — Ambulatory Visit: Payer: Self-pay | Admitting: Advanced Practice Midwife

## 2023-11-19 ENCOUNTER — Encounter: Payer: Self-pay | Admitting: Advanced Practice Midwife

## 2023-11-19 NOTE — Telephone Encounter (Signed)
 Daughter called about Kelli [name] urine culture they we're never contacted with the results. I gave her the results and advised her she can stop the antibiotic as she does not have a uti. They are still concerned cause her mom is still having the burning during urination

## 2023-11-21 NOTE — Progress Notes (Unsigned)
    GYNECOLOGY PROGRESS NOTE  Subjective:  PCP: Diedra Lame, MD  Patient ID: Kelli [name], female    DOB: 1934-10-15, 89 y.o.   MRN: [id number]  HPI  Patient is a 89 y.o. G76P2002 female who presents for continued burning with urination despite negative urine culture with Slater Rains CNM 11/13/23. Pt has dementia and is accompanied by her daughter who lives in [place] , visiting, says she has noticed blood in her urine over the last few days.  PMH of lichen sclerosus and vulvovaginal atrophy, which was previously managed by Dr. Connell with Temovate  and Premarin , pt is no longer using either of those for an uncertain amount of time. Daughter states she was unaware of those conditions or medications. Pt seems to have no recollection of them today.   The following portions of the patient's history were reviewed and updated as appropriate: allergies, current medications, past family history, past medical history, past social history, past surgical history, and problem list.  Review of Systems Pertinent items are noted in HPI.   Objective:   Blood pressure (!) 143/74, pulse 87. There is no height or weight on file to calculate BMI.  General appearance: alert and cooperative Abdomen: soft, non-tender; bowel sounds normal; no masses,  no organomegaly Pelvic: Significant urethral prolapse, is red/raw; vulva with mild lichenification bilaterally; vaginal mucosa atrophic. No bimanual done.  Extremities: extremities normal, atraumatic, no cyanosis or edema Neurologic: Grossly normal; memory impaired   Assessment/Plan:   1. Dysuria   2. Vulvar disorder (lichen sclerosus suspected)   3. Urethral prolapse   4. Atrophic vaginitis    89 y.o. [id number] with chronic dysuria with negative urine cultures, PMH of suspected LS, VVA, and not using Temovate  or Premarin . Also with urethral prolapse today on exam. Discussed how pt's conditions untreated can contribute to her dysuria and need to get back on  the medications. We did a UA in office that had +LE, will send for culture, but suspect will be negative. Daughter committed to helping pt get back on medication regimen. Did self-teaching with pt on how to apply creams.  -Rx for Temovate  and Estradiol sent today -- due to dementia and pt living alone, will have her apply daily for consistency.  -Ucx sent  Total time was 38 minutes. That includes chart review before the visit, the actual patient visit, and time spent on documentation after the visit. Time excludes procedures, if any.    Follow up 12 mos, sooner prn  Estil Mangle, DO [facility]

## 2023-11-25 ENCOUNTER — Encounter: Payer: Self-pay | Admitting: Obstetrics

## 2023-11-25 ENCOUNTER — Telehealth: Payer: Self-pay

## 2023-11-25 ENCOUNTER — Ambulatory Visit: Admitting: Obstetrics

## 2023-11-25 VITALS — BP 143/74 | HR 87

## 2023-11-25 DIAGNOSIS — N952 Postmenopausal atrophic vaginitis: Secondary | ICD-10-CM

## 2023-11-25 DIAGNOSIS — N909 Noninflammatory disorder of vulva and perineum, unspecified: Secondary | ICD-10-CM

## 2023-11-25 DIAGNOSIS — N368 Other specified disorders of urethra: Secondary | ICD-10-CM

## 2023-11-25 DIAGNOSIS — R300 Dysuria: Secondary | ICD-10-CM | POA: Diagnosis not present

## 2023-11-25 LAB — POCT URINALYSIS DIPSTICK
Bilirubin, UA: NEGATIVE
Blood, UA: POSITIVE
Glucose, UA: NEGATIVE
Ketones, UA: NEGATIVE
Nitrite, UA: NEGATIVE
Protein, UA: NEGATIVE
Spec Grav, UA: 1.01 (ref 1.010–1.025)
Urobilinogen, UA: 0.2 U/dL
pH, UA: 6 (ref 5.0–8.0)

## 2023-11-25 NOTE — Telephone Encounter (Signed)
 Patient's daughter Glenys called stating her mother has rosacea and was recommended some over the counter options for cleansers/moisturizers to use about a year ago for rosacea. She stated patient is tolerating cleanser well but moisturizer was causing some burning. Daughter wanted to know what suggestions/options Dr. Claudene had for her to use for when patient has flares.   Left voicemail for daughter to ask what products patient was using.

## 2023-11-26 MED ORDER — CLOBETASOL PROPIONATE 0.05 % EX OINT: 1.0000 | TOPICAL_OINTMENT | Freq: Every day | CUTANEOUS | 5 refills | Status: AC

## 2023-11-26 MED ORDER — ESTRADIOL 0.01 % VA CREA: 0.2500 | TOPICAL_CREAM | Freq: Every day | VAGINAL | 3 refills | Status: AC

## 2023-11-26 NOTE — Addendum Note (Signed)
 Addended by: LEIGH SOBER on: 11/26/2023 03:59 PM   Modules accepted: Orders

## 2023-11-27 ENCOUNTER — Encounter: Payer: Self-pay | Admitting: Dermatology

## 2023-11-27 ENCOUNTER — Ambulatory Visit: Admitting: Dermatology

## 2023-11-27 DIAGNOSIS — D2372 Other benign neoplasm of skin of left lower limb, including hip: Secondary | ICD-10-CM

## 2023-11-27 DIAGNOSIS — L719 Rosacea, unspecified: Secondary | ICD-10-CM

## 2023-11-27 DIAGNOSIS — L858 Other specified epidermal thickening: Secondary | ICD-10-CM

## 2023-11-27 DIAGNOSIS — I872 Venous insufficiency (chronic) (peripheral): Secondary | ICD-10-CM

## 2023-11-27 DIAGNOSIS — D239 Other benign neoplasm of skin, unspecified: Secondary | ICD-10-CM

## 2023-11-27 DIAGNOSIS — L853 Xerosis cutis: Secondary | ICD-10-CM

## 2023-11-27 LAB — URINE CULTURE

## 2023-11-27 MED ORDER — METRONIDAZOLE 0.75 % EX CREA: TOPICAL_CREAM | CUTANEOUS | 5 refills | Status: DC

## 2023-11-27 NOTE — Telephone Encounter (Signed)
 Scheduled patient to come in today at 3:30pm. Daughter voiced understanding to all.

## 2023-11-27 NOTE — Telephone Encounter (Signed)
 Called patient's daughter and advised her of recommendations. Patient's daughter says patient's face is broken out really bad & inflamed and she is wanting to know if patient can be seen.   Please advise

## 2023-11-27 NOTE — Progress Notes (Signed)
 Follow Up Visit   Subjective  Kelli [name] is a 89 y.o. female who presents for the following: Rash on face. Few days ago was red and inflamed, this morning has red spots and blotches. Has been using Vanicream line of products. Tolerates Vanicream cleanser but Vanicream lotion causes burning on face so she stopped using this week. Has been diagnosed with rosacea in the past. No hx of Rx treatment.   Check legs. C/O swelling and scattered spots.   Daughter, Glenys, is with patient and contributes to history.   The following portions of the chart were reviewed this encounter and updated as appropriate: medications, allergies, medical history  Review of Systems:  No other skin or systemic complaints except as noted in HPI or Assessment and Plan.  Objective  Well appearing patient in no apparent distress; mood and affect are within normal limits.  A focused examination was performed of the following areas: Face and legs  Relevant exam findings are noted in the Assessment and Plan.    Assessment & Plan   ROSACEA   DERMATOFIBROMA   XEROSIS CUTIS   VENOUS STASIS DERMATITIS OF BOTH LOWER EXTREMITIES   RETENTION HYPERKERATOSIS    ROSACEA Exam Mid face erythema with telangiectasias +/- scattered inflammatory papules  Chronic and persistent condition with duration or expected duration over one year. Condition is bothersome/symptomatic for patient. Currently flared.   Rosacea is a chronic progressive skin condition usually affecting the face of adults, causing redness and/or acne bumps. It is treatable but not curable. It sometimes affects the eyes (ocular rosacea) as well. It may respond to topical and/or systemic medication and can flare with stress, sun exposure, alcohol, exercise, topical steroids (including hydrocortisone/cortisone 10) and some foods.  Daily application of broad spectrum spf 30+ sunscreen to face is recommended to reduce flares.  Patient denies  grittiness of the eyes  Treatment Plan  Start Metronidazole 0.75% cream apply twice daily to face after cleansing with Vanicream cleanser. Use twice daily every day to control and prevent rosacea. Avoiding doxy given allergy (rash)  STASIS DERMATITIS Exam:  b/l lower leg edema and capillaritis  Chronic and persistent condition with duration or expected duration over one year. Condition is bothersome/symptomatic for patient. Currently flared.   Stasis in the legs causes chronic leg swelling, which may result in itchy or painful rashes, skin discoloration, skin texture changes, and sometimes ulceration.    Treatment Plan: Recommend daily graduated compression hose/stockings- easiest to put on first thing in morning, remove at bedtime.  Elevate legs as much as possible. Avoid salt/sodium rich foods.  DERMATOFIBROMA Exam: Firm pink/brown papulenodule with dimple sign at medial left calf.  Treatment Plan: A dermatofibroma is a benign growth possibly related to trauma, such as an insect bite, cut from shaving, or inflamed acne-type bump.  Treatment options to remove include shave or excision with resulting scar and risk of recurrence.  Since benign-appearing and not bothersome, will observe for now.    Xerosis - diffuse xerotic patches at legs - recommend gentle, hydrating skin care - gentle skin care handout given  Retention hyperkeratosis  Exam: adherent non-inflammatory scale at B/L legs; wiped off with rubbing alcohol.   Treatment: Benign.    Return in about 2 months (around 01/27/2024) for Rosacea Follow Up.  I, Kate Fought, CMA, am acting as scribe for Boneta Sharps, MD.   Documentation: I have reviewed the above documentation for accuracy and completeness, and I agree with the above.  Boneta Sharps, MD

## 2023-11-27 NOTE — Patient Instructions (Addendum)
 Start Metronidazole 0.75% cream apply twice daily to face after cleansing with Vanicream cleanser. Use twice daily every day to control and prevent rosacea.   Continue Vanicream facial regimen currently using.    Recommend daily graduated compression hose/stockings- easiest to put on first thing in morning, remove at bedtime.  Elevate legs as much as possible. Avoid salt/sodium rich foods.     Due to recent changes in healthcare laws, you may see results of your pathology and/or laboratory studies on MyChart before the doctors have had a chance to review them. We understand that in some cases there may be results that are confusing or concerning to you. Please understand that not all results are received at the same time and often the doctors may need to interpret multiple results in order to provide you with the best plan of care or course of treatment. Therefore, we ask that you please give us  2 business days to thoroughly review all your results before contacting the office for clarification. Should we see a critical lab result, you will be contacted sooner.   If You Need Anything After Your Visit  If you have any questions or concerns for your doctor, please call our main line at [phone] and press option 4 to reach your doctor's medical assistant. If no one answers, please leave a voicemail as directed and we will return your call as soon as possible. Messages left after 4 pm will be answered the following business day.   You may also send us  a message via MyChart. We typically respond to MyChart messages within 1-2 business days.  For prescription refills, please ask your pharmacy to contact our office. Our fax number is [phone].  If you have an urgent issue when the clinic is closed that cannot wait until the next business day, you can page your doctor at the number below.    Please note that while we do our best to be available for urgent issues outside of office hours, we  are not available 24/7.   If you have an urgent issue and are unable to reach us , you may choose to seek medical care at your doctor's office, retail clinic, urgent care center, or emergency room.  If you have a medical emergency, please immediately call 911 or go to the emergency department.  Pager Numbers  - Dr. Hester: [phone]  - Dr. Jackquline: [phone]  - Dr. Claudene: [phone]   - Dr. Raymund: [phone]  In the event of inclement weather, please call our main line at [phone] for an update on the status of any delays or closures.  Dermatology Medication Tips: Please keep the boxes that topical medications come in in order to help keep track of the instructions about where and how to use these. Pharmacies typically print the medication instructions only on the boxes and not directly on the medication tubes.   If your medication is too expensive, please contact our office at [phone] option 4 or send us  a message through MyChart.   We are unable to tell what your co-pay for medications will be in advance as this is different depending on your insurance coverage. However, we may be able to find a substitute medication at lower cost or fill out paperwork to get insurance to cover a needed medication.   If a prior authorization is required to get your medication covered by your insurance company, please allow us  1-2 business days to complete this process.  Drug prices often vary depending on where  the prescription is filled and some pharmacies may offer cheaper prices.  The website www.goodrx.com contains coupons for medications through different pharmacies. The prices here do not account for what the cost may be with help from insurance (it may be cheaper with your insurance), but the website can give you the price if you did not use any insurance.  - You can print the associated coupon and take it with your prescription to the pharmacy.  - You may also stop by our  office during regular business hours and pick up a GoodRx coupon card.  - If you need your prescription sent electronically to a different pharmacy, notify our office through [facility] or by phone at [phone] option 4.     Si Usted Necesita Algo Despus de Su Visita  Tambin puede enviarnos un mensaje a travs de Clinical Cytogeneticist. Por lo general respondemos a los mensajes de MyChart en el transcurso de 1 a 2 das hbiles.  Para renovar recetas, por favor pida a su farmacia que se ponga en contacto con nuestra oficina. Randi lakes de fax es [place] [phone].  Si tiene un asunto urgente cuando la clnica est cerrada y que no puede esperar hasta el siguiente da hbil, puede llamar/localizar a su doctor(a) al nmero que aparece a continuacin.   Por favor, tenga en cuenta que aunque hacemos todo lo posible para estar disponibles para asuntos urgentes fuera del horario de [place], no estamos disponibles las 24 horas del da, los 7 [address]  [address] de la [place].   Si tiene un problema urgente y no puede comunicarse con nosotros, puede optar por buscar atencin mdica  en el consultorio de su doctor(a), en una clnica privada, en un centro de atencin urgente o en una sala de emergencias.  Si tiene engineer, drilling, por favor llame inmediatamente al 911 o vaya a la sala de emergencias.  Nmeros de bper  - Dr. Hester: [phone]  - Dra. Jackquline: [phone]  - Dr. Claudene: [phone]  - Dra. Kitts: [phone]  En caso de inclemencias del [place], por favor llame a nuestra lnea principal al [phone] para una actualizacin sobre el estado de cualquier retraso o cierre.  Consejos para la medicacin en dermatologa: Por favor, guarde las cajas en las que vienen los medicamentos de uso tpico para ayudarle a seguir las instrucciones sobre dnde y cmo usarlos. Las farmacias generalmente imprimen las instrucciones del medicamento slo en las cajas y no directamente en los tubos del  [place].   Si su medicamento es muy caro, por favor, pngase en contacto con landry rieger llamando al [phone] y presione la opcin 4 o envenos un mensaje a travs de Clinical Cytogeneticist.   No podemos decirle cul ser su copago por los medicamentos por adelantado ya que esto es diferente dependiendo de la cobertura de su seguro. Sin embargo, es posible que podamos encontrar un medicamento sustituto a audiological scientist un formulario para que el seguro cubra el medicamento que se considera necesario.   Si se requiere una autorizacin previa para que su compaa de seguros cubra su medicamento, por favor permtanos de 1 a 2 das hbiles para completar este proceso.  Los precios de los medicamentos varan con frecuencia dependiendo del environmental consultant de dnde se surte la receta y alguna farmacias pueden ofrecer precios ms baratos.  El sitio web www.goodrx.com tiene cupones para medicamentos de health and safety inspector. Los precios aqu no tienen en cuenta lo que podra costar con la ayuda del seguro (puede ser ms barato con  su seguro), pero el sitio web puede darle el precio si no visual merchandiser.  - Puede imprimir el cupn correspondiente y llevarlo con su receta a la farmacia.  - Tambin puede pasar por nuestra oficina durante el horario de atencin regular y education officer, museum una tarjeta de cupones de GoodRx.  - Si necesita que su receta se enve electrnicamente a una farmacia diferente, informe a nuestra oficina a travs de MyChart de [place] o por telfono llamando al [phone] y presione la opcin 4.

## 2023-11-28 ENCOUNTER — Ambulatory Visit: Payer: Self-pay | Admitting: Obstetrics

## 2023-11-28 NOTE — Progress Notes (Signed)
 Relayed message to patient's daughter re: negative urine culture.  She says they are using the creams daily but she is still complaining of discomfort.  Reassured her that consistency is key and it can take time for the skin to heal and feel better.  She will give it more time to see improvement.

## 2023-12-08 ENCOUNTER — Ambulatory Visit: Admitting: Dermatology

## 2023-12-15 ENCOUNTER — Telehealth: Payer: Self-pay

## 2023-12-15 NOTE — Telephone Encounter (Signed)
 Received call on triage line from patient's daughter who reports patient continue to complain of vaginal burning.  She says she seems to be doing better, she doesn't complain about it daily, but she mentioned it spontaneously yesterday, and if they do ask  how it feels when she uses the bathroom she usually still says it burns.  The daughter says she  think patient is using her creams nightly - her son puts the medication on her finger and she goes in the bathroom and applies it nightly.  Daughter wants to know whether she should come back in to be seen.  Advised it can take several weeks to months for these creams to bring full relief, and she may always have some intermittent discomfort given her age, dementia, and urinary incontinence.  Encouraged her to continue using the creams, as consistency is key to treatment.  Also discussed trying cold peri pads during the day when she complains of discomfort.  Encouraged her to change her incontinence pads frequently to prevent skin breakdown.  Daughter will be back in town in February.  Advised we like to f/u about 3 months after starting these creams, so she should call for an appointment while she is in town.  If patient's discomfort worsens, she is welcome to call back for a sooner appointment.  Patient's daughter verbalized understanding and agreement.

## 2024-01-29 ENCOUNTER — Ambulatory Visit: Admitting: Dermatology

## 2024-01-29 ENCOUNTER — Encounter: Payer: Self-pay | Admitting: Dermatology

## 2024-01-29 DIAGNOSIS — Z7189 Other specified counseling: Secondary | ICD-10-CM | POA: Diagnosis not present

## 2024-01-29 DIAGNOSIS — L719 Rosacea, unspecified: Secondary | ICD-10-CM

## 2024-01-29 DIAGNOSIS — Z79899 Other long term (current) drug therapy: Secondary | ICD-10-CM

## 2024-01-29 MED ORDER — IVERMECTIN 1 % EX CREA: 1.0000 | TOPICAL_CREAM | Freq: Every day | CUTANEOUS | 6 refills | Status: AC

## 2024-01-29 NOTE — Progress Notes (Signed)
" ° °  Follow-Up Visit   Subjective  Kelli [name] is a 89 y.o. female who presents for the following: Rosacea face, 64m f/u, D/C Metronidazole  cr due to making face worse, patient's son thinks my be allergic, flares prn  Patient accompanied by son who contributes to history.   The following portions of the chart were reviewed this encounter and updated as appropriate: medications, allergies, medical history  Review of Systems:  No other skin or systemic complaints except as noted in HPI or Assessment and Plan.  Objective  Well appearing patient in no apparent distress; mood and affect are within normal limits.   A focused examination was performed of the following areas: face  Relevant exam findings are noted in the Assessment and Plan.    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 Condition is symptomatic/ bothersome to patient. Not currently at goal.   Rosacea is a chronic progressive skin condition usually affecting the face of adults, causing redness and/or acne bumps. It is treatable but not curable. It sometimes affects the eyes (ocular rosacea) as well. It may respond to topical and/or systemic medication and can flare with stress, sun exposure, alcohol, exercise, topical steroids (including hydrocortisone/cortisone 10) and some foods.  Daily application of broad spectrum spf 30+ sunscreen to face is recommended to reduce flares.   Treatment Plan Start Ivermectin  cr qhs to face, will treat bumps but not redness Discussed Oxymetazoline creams  Counseling for BBL / IPL / Laser and Coordination of Care Discussed the treatment option of Broad Band Light (BBL) /Intense Pulsed Light (IPL)/ Laser for skin discoloration, including brown spots and  redness.  Typically we recommend at least 1-3 treatment sessions about 5-8 weeks apart for best results.  Cannot have tanned skin when BBL performed, and regular use of sunscreen/photoprotection is advised after the procedure to help maintain results. The patient's condition may also require maintenance treatments in the future.  The fee for BBL / laser treatments is $350 per treatment session for the whole face.  A fee can be quoted for other parts of the body.  Insurance typically does not pay for BBL/laser treatments and therefore the fee is an out-of-pocket cost. Recommend prophylactic valtrex treatment. Once scheduled for procedure, will send Rx in prior to patient's appointment.   ROSACEA   This Visit - Ivermectin  1 % CREA - Apply 1 Application topically at bedtime. COUNSELING AND COORDINATION OF CARE   MEDICATION MANAGEMENT    Return for 2-3 months Rosacea f/u.  I, Grayce Saunas, RMA, am acting as scribe for Boneta Sharps, MD .   Documentation: I have reviewed the above documentation for accuracy and completeness, and I agree with the above.  Boneta Sharps, MD    "

## 2024-01-29 NOTE — Patient Instructions (Addendum)
 Rosacea  What is rosacea? Rosacea (say: ro-zay-sha) is a common skin disease that usually begins as a trend of flushing or blushing easily.  As rosacea progresses, a persistent redness in the center of the face will develop and may gradually spread beyond the nose and cheeks to the forehead and chin.  In some cases, the ears, chest, and back could be affected.  Rosacea may appear as tiny blood vessels or small red bumps that occur in crops.  Frequently they can contain pus, and are called "pustules".  If the bumps do not contain pus, they are referred to as "papules".  Rarely, in prolonged, untreated cases of rosacea, the oil glands of the nose and cheeks may become permanently enlarged.  This is called rhinophyma, and is seen more frequently in men.  Signs and Risks In its beginning stages, rosacea tends to come and go, which makes it difficult to recognize.  It can start as intermittent flushing of the face.  Eventually, blood vessels may become permanently visible.  Pustules and papules can appear, but can be mistaken for adult acne.  People of all races, ages, genders and ethnic groups are at risk of developing rosacea.  However, it is more common in women (especially around menopause) and adults with fair skin between the ages of 26 and 90.  Treatment Dermatologists typically recommend a combination of treatments to effectively manage rosacea.  Treatment can improve symptoms and may stop the progression of the rosacea.  Treatment may involve both topical and oral medications.  The tetracycline antibiotics are often used for their anti-inflammatory effect; however, because of the possibility of developing antibiotic resistance, they should not be used long term at full dose.  For dilated blood vessels the options include electrodessication (uses electric current through a small needle), laser treatment, and cosmetics to hide the redness.   With all forms of treatment, improvement is a slow process, and  patients may not see any results for the first 3-4 weeks.  It is very important to avoid the sun and other triggers.  Patients must wear sunscreen daily.  Skin Care Instructions: Cleanse the skin with a mild soap such as CeraVe cleanser, Cetaphil cleanser, or Dove soap once or twice daily as needed. Moisturize with Eucerin Redness Relief Daily Perfecting Lotion (has a subtle green tint), CeraVe Moisturizing Cream, or Oil of Olay Daily Moisturizer with sunscreen every morning and/or night as recommended. Makeup should be "non-comedogenic" (won't clog pores) and be labeled "for sensitive skin". Good choices for cosmetics are: Neutrogena, Almay, and Physician's Formula.  Any product with a green tint tends to offset a red complexion. If your eyes are dry and irritated, use artificial tears 2-3 times per day and cleanse the eyelids daily with baby shampoo.  Have your eyes examined at least every 2 years.  Be sure to tell your eye doctor that you have rosacea. Alcoholic beverages tend to cause flushing of the skin, and may make rosacea worse. Always wear sunscreen, protect your skin from extreme hot and cold temperatures, and avoid spicy foods, hot drinks, and mechanical irritation such as rubbing, scrubbing, or massaging the face.  Avoid harsh skin cleansers, cleansing masks, astringents, and exfoliation. If a particular product burns or makes your face feel tight, then it is likely to flare your rosacea. If you are having difficulty finding a sunscreen that you can tolerate, you may try switching to a chemical-free sunscreen.  These are ones whose active ingredient is zinc oxide or titanium dioxide  only.  They should also be fragrance free, non-comedogenic, and labeled for sensitive skin. Rosacea triggers may vary from person to person.  There are a variety of foods that have been reported to trigger rosacea.  Some patients find that keeping a diary of what they were doing when they flared helps them avoid  triggers.   Due to recent changes in healthcare laws, you may see results of your pathology and/or laboratory studies on MyChart before the doctors have had a chance to review them. We understand that in some cases there may be results that are confusing or concerning to you. Please understand that not all results are received at the same time and often the doctors may need to interpret multiple results in order to provide you with the best plan of care or course of treatment. Therefore, we ask that you please give us  2 business days to thoroughly review all your results before contacting the office for clarification. Should we see a critical lab result, you will be contacted sooner.   If You Need Anything After Your Visit  If you have any questions or concerns for your doctor, please call our main line at [phone] and press option 4 to reach your doctor's medical assistant. If no one answers, please leave a voicemail as directed and we will return your call as soon as possible. Messages left after 4 pm will be answered the following business day.   You may also send us  a message via MyChart. We typically respond to MyChart messages within 1-2 business days.  For prescription refills, please ask your pharmacy to contact our office. Our fax number is [phone].  If you have an urgent issue when the clinic is closed that cannot wait until the next business day, you can page your doctor at the number below.    Please note that while we do our best to be available for urgent issues outside of office hours, we are not available 24/7.   If you have an urgent issue and are unable to reach us , you may choose to seek medical care at your doctor's office, retail clinic, urgent care center, or emergency room.  If you have a medical emergency, please immediately call 911 or go to the emergency department.  Pager Numbers  - Dr. Hester: [phone]  - Dr. Jackquline: [phone]  - Dr. Claudene:  [phone]   - Dr. Raymund: [phone]  In the event of inclement weather, please call our main line at [phone] for an update on the status of any delays or closures.  Dermatology Medication Tips: Please keep the boxes that topical medications come in in order to help keep track of the instructions about where and how to use these. Pharmacies typically print the medication instructions only on the boxes and not directly on the medication tubes.   If your medication is too expensive, please contact our office at [phone] option 4 or send us  a message through MyChart.   We are unable to tell what your co-pay for medications will be in advance as this is different depending on your insurance coverage. However, we may be able to find a substitute medication at lower cost or fill out paperwork to get insurance to cover a needed medication.   If a prior authorization is required to get your medication covered by your insurance company, please allow us  1-2 business days to complete this process.  Drug prices often vary depending on where the prescription is filled and some pharmacies  may offer cheaper prices.  The website www.goodrx.com contains coupons for medications through different pharmacies. The prices here do not account for what the cost may be with help from insurance (it may be cheaper with your insurance), but the website can give you the price if you did not use any insurance.  - You can print the associated coupon and take it with your prescription to the pharmacy.  - You may also stop by our office during regular business hours and pick up a GoodRx coupon card.  - If you need your prescription sent electronically to a different pharmacy, notify our office through [facility] or by phone at [phone] option 4.     Si Usted Necesita Algo Despus de Su Visita  Tambin puede enviarnos un mensaje a travs de Clinical Cytogeneticist. Por lo general respondemos a los mensajes de  MyChart en el transcurso de 1 a 2 das hbiles.  Para renovar recetas, por favor pida a su farmacia que se ponga en contacto con nuestra oficina. Randi lakes de fax es [place] [phone].  Si tiene un asunto urgente cuando la clnica est cerrada y que no puede esperar hasta el siguiente da hbil, puede llamar/localizar a su doctor(a) al nmero que aparece a continuacin.   Por favor, tenga en cuenta que aunque hacemos todo lo posible para estar disponibles para asuntos urgentes fuera del horario de [place], no estamos disponibles las 24 horas del da, los 7 [address]  [address] de la [place].   Si tiene un problema urgente y no puede comunicarse con nosotros, puede optar por buscar atencin mdica  en el consultorio de su doctor(a), en una clnica privada, en un centro de atencin urgente o en una sala de emergencias.  Si tiene engineer, drilling, por favor llame inmediatamente al 911 o vaya a la sala de emergencias.  Nmeros de bper  - Dr. Hester: [phone]  - Dra. Jackquline: [phone]  - Dr. Claudene: [phone]  - Dra. Kitts: [phone]  En caso de inclemencias del [place], por favor llame a nuestra lnea principal al [phone] para una actualizacin sobre el estado de cualquier retraso o cierre.  Consejos para la medicacin en dermatologa: Por favor, guarde las cajas en las que vienen los medicamentos de uso tpico para ayudarle a seguir las instrucciones sobre dnde y cmo usarlos. Las farmacias generalmente imprimen las instrucciones del medicamento slo en las cajas y no directamente en los tubos del [place].   Si su medicamento es muy caro, por favor, pngase en contacto con landry rieger llamando al [phone] y presione la opcin 4 o envenos un mensaje a travs de Clinical Cytogeneticist.   No podemos decirle cul ser su copago por los medicamentos por adelantado ya que esto es diferente dependiendo de la cobertura de su seguro. Sin embargo, es posible que podamos encontrar un  medicamento sustituto a audiological scientist un formulario para que el seguro cubra el medicamento que se considera necesario.   Si se requiere una autorizacin previa para que su compaa de seguros cubra su medicamento, por favor permtanos de 1 a 2 das hbiles para completar este proceso.  Los precios de los medicamentos varan con frecuencia dependiendo del environmental consultant de dnde se surte la receta y alguna farmacias pueden ofrecer precios ms baratos.  El sitio web www.goodrx.com tiene cupones para medicamentos de health and safety inspector. Los precios aqu no tienen en cuenta lo que podra costar con la ayuda del seguro (puede ser ms barato con su seguro), pero el sitio web puede  darle el precio si no utiliz [employer].  - Puede imprimir el cupn correspondiente y llevarlo con su receta a la farmacia.  - Tambin puede pasar por nuestra oficina durante el horario de atencin regular y education officer, museum una tarjeta de cupones de GoodRx.  - Si necesita que su receta se enve electrnicamente a una farmacia diferente, informe a nuestra oficina a travs de MyChart de [place] o por telfono llamando al [phone] y presione la opcin 4.

## 2024-02-27 ENCOUNTER — Ambulatory Visit: Admitting: Podiatry

## 2024-04-29 ENCOUNTER — Ambulatory Visit: Admitting: Dermatology

## 2024-05-28 ENCOUNTER — Ambulatory Visit: Admitting: Podiatry

## 2024-09-16 ENCOUNTER — Ambulatory Visit: Admitting: Dermatology
